# Patient Record
Sex: Female | Born: 1935 | Race: White | Hispanic: No | Smoking: Never smoker
Health system: Southern US, Community
[De-identification: ages and names within clinical notes are randomized; demographics above are authoritative.]

## PROBLEM LIST (undated history)

## (undated) DIAGNOSIS — I10 Essential (primary) hypertension: Secondary | ICD-10-CM

## (undated) DIAGNOSIS — I2510 Atherosclerotic heart disease of native coronary artery without angina pectoris: Secondary | ICD-10-CM

## (undated) DIAGNOSIS — H269 Unspecified cataract: Secondary | ICD-10-CM

## (undated) DIAGNOSIS — E7800 Pure hypercholesterolemia, unspecified: Secondary | ICD-10-CM

## (undated) DIAGNOSIS — M791 Myalgia, unspecified site: Secondary | ICD-10-CM

## (undated) DIAGNOSIS — R110 Nausea: Secondary | ICD-10-CM

## (undated) DIAGNOSIS — M1990 Unspecified osteoarthritis, unspecified site: Secondary | ICD-10-CM

## (undated) HISTORY — DX: Myalgia, unspecified site: M79.10

## (undated) HISTORY — DX: Nausea: R11.0

## (undated) HISTORY — DX: Essential (primary) hypertension: I10

## (undated) HISTORY — DX: Unspecified cataract: H26.9

## (undated) HISTORY — DX: Atherosclerotic heart disease of native coronary artery without angina pectoris: I25.10

## (undated) HISTORY — DX: Pure hypercholesterolemia, unspecified: E78.00

## (undated) HISTORY — PX: CORONARY ARTERY BYPASS GRAFT: SHX141

---

## 1998-02-06 ENCOUNTER — Ambulatory Visit (HOSPITAL_COMMUNITY): Admission: RE | Admit: 1998-02-06 | Discharge: 1998-02-06 | Payer: Self-pay | Admitting: Obstetrics and Gynecology

## 1998-02-06 ENCOUNTER — Encounter: Payer: Self-pay | Admitting: Obstetrics and Gynecology

## 1998-02-11 ENCOUNTER — Other Ambulatory Visit: Admission: RE | Admit: 1998-02-11 | Discharge: 1998-02-11 | Payer: Self-pay | Admitting: Obstetrics and Gynecology

## 1999-02-12 ENCOUNTER — Other Ambulatory Visit: Admission: RE | Admit: 1999-02-12 | Discharge: 1999-02-12 | Payer: Self-pay | Admitting: Podiatry

## 1999-03-05 ENCOUNTER — Other Ambulatory Visit: Admission: RE | Admit: 1999-03-05 | Discharge: 1999-03-05 | Payer: Self-pay | Admitting: Obstetrics and Gynecology

## 1999-12-24 ENCOUNTER — Encounter: Payer: Self-pay | Admitting: Obstetrics and Gynecology

## 1999-12-24 ENCOUNTER — Encounter: Admission: RE | Admit: 1999-12-24 | Discharge: 1999-12-24 | Payer: Self-pay | Admitting: Obstetrics and Gynecology

## 2000-01-04 ENCOUNTER — Other Ambulatory Visit: Admission: RE | Admit: 2000-01-04 | Discharge: 2000-01-04 | Payer: Self-pay | Admitting: Obstetrics and Gynecology

## 2001-01-03 ENCOUNTER — Encounter: Payer: Self-pay | Admitting: Obstetrics and Gynecology

## 2001-01-03 ENCOUNTER — Encounter: Admission: RE | Admit: 2001-01-03 | Discharge: 2001-01-03 | Payer: Self-pay | Admitting: Obstetrics and Gynecology

## 2001-06-01 ENCOUNTER — Ambulatory Visit (HOSPITAL_COMMUNITY): Admission: RE | Admit: 2001-06-01 | Discharge: 2001-06-01 | Payer: Self-pay | Admitting: Gastroenterology

## 2002-08-14 ENCOUNTER — Encounter: Payer: Self-pay | Admitting: Obstetrics and Gynecology

## 2002-08-14 ENCOUNTER — Encounter: Admission: RE | Admit: 2002-08-14 | Discharge: 2002-08-14 | Payer: Self-pay | Admitting: Obstetrics and Gynecology

## 2003-08-27 ENCOUNTER — Other Ambulatory Visit: Admission: RE | Admit: 2003-08-27 | Discharge: 2003-08-27 | Payer: Self-pay | Admitting: Obstetrics and Gynecology

## 2003-09-17 ENCOUNTER — Encounter: Admission: RE | Admit: 2003-09-17 | Discharge: 2003-09-17 | Payer: Self-pay | Admitting: Obstetrics and Gynecology

## 2004-09-17 ENCOUNTER — Encounter: Admission: RE | Admit: 2004-09-17 | Discharge: 2004-09-17 | Payer: Self-pay | Admitting: Obstetrics and Gynecology

## 2004-09-28 ENCOUNTER — Other Ambulatory Visit: Admission: RE | Admit: 2004-09-28 | Discharge: 2004-09-28 | Payer: Self-pay | Admitting: Obstetrics and Gynecology

## 2005-09-20 ENCOUNTER — Encounter: Admission: RE | Admit: 2005-09-20 | Discharge: 2005-09-20 | Payer: Self-pay | Admitting: Obstetrics and Gynecology

## 2005-10-07 ENCOUNTER — Encounter: Admission: RE | Admit: 2005-10-07 | Discharge: 2005-10-07 | Payer: Self-pay | Admitting: Obstetrics and Gynecology

## 2005-10-13 ENCOUNTER — Other Ambulatory Visit: Admission: RE | Admit: 2005-10-13 | Discharge: 2005-10-13 | Payer: Self-pay | Admitting: Obstetrics and Gynecology

## 2006-04-05 ENCOUNTER — Encounter: Admission: RE | Admit: 2006-04-05 | Discharge: 2006-04-05 | Payer: Self-pay | Admitting: Orthopedic Surgery

## 2006-04-21 ENCOUNTER — Encounter: Admission: RE | Admit: 2006-04-21 | Discharge: 2006-04-21 | Payer: Self-pay | Admitting: Orthopedic Surgery

## 2006-05-02 ENCOUNTER — Encounter: Admission: RE | Admit: 2006-05-02 | Discharge: 2006-05-02 | Payer: Self-pay | Admitting: Orthopedic Surgery

## 2006-08-24 ENCOUNTER — Encounter: Admission: RE | Admit: 2006-08-24 | Discharge: 2006-08-24 | Payer: Self-pay | Admitting: Neurosurgery

## 2006-09-29 ENCOUNTER — Inpatient Hospital Stay (HOSPITAL_COMMUNITY): Admission: RE | Admit: 2006-09-29 | Discharge: 2006-10-03 | Payer: Self-pay | Admitting: Neurosurgery

## 2006-11-16 ENCOUNTER — Encounter: Admission: RE | Admit: 2006-11-16 | Discharge: 2006-11-16 | Payer: Self-pay | Admitting: Obstetrics and Gynecology

## 2006-11-24 ENCOUNTER — Other Ambulatory Visit: Admission: RE | Admit: 2006-11-24 | Discharge: 2006-11-24 | Payer: Self-pay | Admitting: Obstetrics and Gynecology

## 2007-07-10 ENCOUNTER — Ambulatory Visit: Payer: Self-pay | Admitting: Vascular Surgery

## 2007-07-10 ENCOUNTER — Ambulatory Visit (HOSPITAL_COMMUNITY): Admission: RE | Admit: 2007-07-10 | Discharge: 2007-07-10 | Payer: Self-pay | Admitting: Cardiology

## 2007-07-10 ENCOUNTER — Encounter: Payer: Self-pay | Admitting: Cardiology

## 2007-07-10 ENCOUNTER — Inpatient Hospital Stay (HOSPITAL_BASED_OUTPATIENT_CLINIC_OR_DEPARTMENT_OTHER): Admission: RE | Admit: 2007-07-10 | Discharge: 2007-07-10 | Payer: Self-pay | Admitting: Cardiology

## 2007-07-17 ENCOUNTER — Ambulatory Visit: Payer: Self-pay | Admitting: Surgery

## 2007-07-23 ENCOUNTER — Ambulatory Visit: Payer: Self-pay | Admitting: Surgery

## 2007-07-23 ENCOUNTER — Inpatient Hospital Stay (HOSPITAL_COMMUNITY): Admission: RE | Admit: 2007-07-23 | Discharge: 2007-08-01 | Payer: Self-pay | Admitting: Surgery

## 2007-08-14 ENCOUNTER — Ambulatory Visit: Payer: Self-pay | Admitting: Surgery

## 2007-08-16 ENCOUNTER — Encounter (HOSPITAL_COMMUNITY): Admission: RE | Admit: 2007-08-16 | Discharge: 2007-11-14 | Payer: Self-pay | Admitting: Cardiology

## 2007-11-20 ENCOUNTER — Encounter: Admission: RE | Admit: 2007-11-20 | Discharge: 2007-11-20 | Payer: Self-pay | Admitting: Obstetrics and Gynecology

## 2007-11-27 ENCOUNTER — Other Ambulatory Visit: Admission: RE | Admit: 2007-11-27 | Discharge: 2007-11-27 | Payer: Self-pay | Admitting: Obstetrics and Gynecology

## 2007-12-03 ENCOUNTER — Encounter: Admission: RE | Admit: 2007-12-03 | Discharge: 2007-12-03 | Payer: Self-pay | Admitting: Obstetrics and Gynecology

## 2008-06-19 ENCOUNTER — Emergency Department (HOSPITAL_COMMUNITY): Admission: EM | Admit: 2008-06-19 | Discharge: 2008-06-19 | Payer: Self-pay | Admitting: Emergency Medicine

## 2008-11-20 ENCOUNTER — Encounter: Admission: RE | Admit: 2008-11-20 | Discharge: 2008-11-20 | Payer: Self-pay | Admitting: Obstetrics and Gynecology

## 2008-12-11 ENCOUNTER — Other Ambulatory Visit: Admission: RE | Admit: 2008-12-11 | Discharge: 2008-12-11 | Payer: Self-pay | Admitting: Obstetrics and Gynecology

## 2009-12-15 ENCOUNTER — Encounter: Admission: RE | Admit: 2009-12-15 | Discharge: 2009-12-15 | Payer: Self-pay | Admitting: Obstetrics and Gynecology

## 2010-02-03 ENCOUNTER — Other Ambulatory Visit
Admission: RE | Admit: 2010-02-03 | Discharge: 2010-02-03 | Payer: Self-pay | Source: Home / Self Care | Admitting: Obstetrics and Gynecology

## 2010-03-29 ENCOUNTER — Ambulatory Visit: Payer: Self-pay | Admitting: Cardiology

## 2010-04-01 ENCOUNTER — Encounter: Admission: RE | Admit: 2010-04-01 | Discharge: 2010-04-01 | Payer: Self-pay | Admitting: Cardiology

## 2010-04-01 ENCOUNTER — Ambulatory Visit: Payer: Self-pay | Admitting: Cardiology

## 2010-07-11 ENCOUNTER — Encounter: Payer: Self-pay | Admitting: Surgery

## 2010-07-11 ENCOUNTER — Encounter: Payer: Self-pay | Admitting: Orthopedic Surgery

## 2010-07-11 ENCOUNTER — Encounter: Payer: Self-pay | Admitting: Obstetrics and Gynecology

## 2010-09-20 ENCOUNTER — Other Ambulatory Visit: Payer: Self-pay | Admitting: Obstetrics and Gynecology

## 2010-09-20 DIAGNOSIS — Z1231 Encounter for screening mammogram for malignant neoplasm of breast: Secondary | ICD-10-CM

## 2010-09-29 ENCOUNTER — Other Ambulatory Visit: Payer: Self-pay | Admitting: *Deleted

## 2010-09-29 DIAGNOSIS — E7800 Pure hypercholesterolemia, unspecified: Secondary | ICD-10-CM

## 2010-10-01 ENCOUNTER — Other Ambulatory Visit (INDEPENDENT_AMBULATORY_CARE_PROVIDER_SITE_OTHER): Payer: BC Managed Care – PPO | Admitting: *Deleted

## 2010-10-01 DIAGNOSIS — E7800 Pure hypercholesterolemia, unspecified: Secondary | ICD-10-CM

## 2010-10-01 LAB — HEPATIC FUNCTION PANEL
AST: 18 U/L (ref 0–37)
Albumin: 3.6 g/dL (ref 3.5–5.2)
Alkaline Phosphatase: 82 U/L (ref 39–117)
Bilirubin, Direct: 0.1 mg/dL (ref 0.0–0.3)

## 2010-10-01 LAB — BASIC METABOLIC PANEL
CO2: 29 mEq/L (ref 19–32)
GFR: 67.69 mL/min (ref >60.00)
Glucose, Bld: 95 mg/dL (ref 70–99)
Potassium: 4.5 mEq/L (ref 3.5–5.1)
Sodium: 141 mEq/L (ref 135–145)

## 2010-10-01 LAB — LIPID PANEL
Total CHOL/HDL Ratio: 3
VLDL: 18 mg/dL (ref 0.0–40.0)

## 2010-10-05 ENCOUNTER — Encounter: Payer: Self-pay | Admitting: Cardiology

## 2010-10-05 DIAGNOSIS — M791 Myalgia, unspecified site: Secondary | ICD-10-CM | POA: Insufficient documentation

## 2010-10-05 DIAGNOSIS — R110 Nausea: Secondary | ICD-10-CM | POA: Insufficient documentation

## 2010-10-05 DIAGNOSIS — I10 Essential (primary) hypertension: Secondary | ICD-10-CM | POA: Insufficient documentation

## 2010-10-05 DIAGNOSIS — I2510 Atherosclerotic heart disease of native coronary artery without angina pectoris: Secondary | ICD-10-CM | POA: Insufficient documentation

## 2010-10-05 DIAGNOSIS — E7800 Pure hypercholesterolemia, unspecified: Secondary | ICD-10-CM | POA: Insufficient documentation

## 2010-10-06 ENCOUNTER — Encounter: Payer: Self-pay | Admitting: Cardiology

## 2010-10-06 ENCOUNTER — Ambulatory Visit (INDEPENDENT_AMBULATORY_CARE_PROVIDER_SITE_OTHER): Payer: BC Managed Care – PPO | Admitting: Cardiology

## 2010-10-06 DIAGNOSIS — I10 Essential (primary) hypertension: Secondary | ICD-10-CM

## 2010-10-06 DIAGNOSIS — E7800 Pure hypercholesterolemia, unspecified: Secondary | ICD-10-CM

## 2010-10-06 NOTE — Assessment & Plan Note (Signed)
This patient has a history of essential hypertension.  His last visit she's been doing well.  She's not having any chest pain or shortness of breath.  She's not having any dizziness or syncope.  She's not been aware of any arrhythmias or tachycardia.

## 2010-10-06 NOTE — Assessment & Plan Note (Signed)
The patient remains on simvastatin for her hypercholesterolemia.  The patient is status post CABG in 2009.  She has not had any significant myalgias from the low dose simvastatin at the present time.

## 2010-10-06 NOTE — Progress Notes (Signed)
[name] Date of Birth:  1934/06/25 [facility] / [facility] [address]   [address] [place], [place]  [address] [phone]           Fax   [phone]  History of Present Illness: This pleasant 76 year old woman is seen for a scheduled followup office visit.  She has a history of coronary disease and had coronary artery bypass graft surgery in thousand and 9.  She's also had hypercholesterolemia and essential hypertension.  She is not having any exertional chest pain.  She's having no shortness of breath palpitations dizziness or syncope.  She's not had any recent ischemic workup.  She remains physically active and still works full-time.  She is careful about her cholesterol but she does eat a lot of salty foods  Current Outpatient Prescriptions  Medication Sig Dispense Refill  . aspirin 81 MG tablet Take 81 mg by mouth daily.        Marland Kitchen losartan (COZAAR) 100 MG tablet Take 100 mg by mouth daily.        . metoprolol tartrate (LOPRESSOR) 25 MG tablet Take 25 mg by mouth 2 (two) times daily.        . Multiple Vitamin (MULTIVITAMIN) tablet Take 1 tablet by mouth daily.        . simvastatin (ZOCOR) 80 MG tablet Take 40 mg by mouth at bedtime.         Allergies  Allergen Reactions  . Amiodarone     rash  . Crestor (Rosuvastatin Calcium)     Leg pain  . Ramipril     cough    Patient Active Problem List  Diagnoses  . Hypertension  . Hypercholesterolemia  . Coronary artery disease  . Nausea  . Myalgia    History  Smoking status  . Never Smoker   Smokeless tobacco  . Not on file    History  Alcohol Use No    Family History  Problem Relation Age of Onset  . Stroke Mother   . Cancer Father     Review of Systems: Constitutional: no fever chills diaphoresis or fatigue or change in weight.  Head and neck: no hearing loss, no epistaxis, no photophobia or visual disturbance. Respiratory: No cough, shortness of breath or wheezing. Cardiovascular: No  chest pain peripheral edema, palpitations. Gastrointestinal: No abdominal distention, no abdominal pain, no change in bowel habits hematochezia or melena. Genitourinary: No dysuria, no frequency, no urgency, no nocturia. Musculoskeletal:No arthralgias, no back pain, no gait disturbance or myalgias. Neurological: No dizziness, no headaches, no numbness, no seizures, no syncope, no weakness, no tremors. Hematologic: No lymphadenopathy, no easy bruising. Psychiatric: No confusion, no hallucinations, no sleep disturbance.    Physical Exam: Filed Vitals:   10/06/10 1041  BP: 140/90  Pulse: 68   weight is 154, up 3 pounds.  The general appearance feels a well-developed well-nourished woman in no distress.Pupils equal and reactive.   Extraocular Movements are full.  There is no scleral icterus.  The mouth and pharynx are normal.  The neck is supple.  The carotids reveal no bruits.  The jugular venous pressure is normal.  The thyroid is not enlarged.  There is no lymphadenopathy.The chest is clear to percussion and auscultation. There are no rales or rhonchi. Expansion of the chest is symmetrical.The precordium is quiet.  The first heart sound is normal.  The second heart sound is physiologically split.  There is no murmur gallop rub or click.  There is no  abnormal lift or heave.The abdomen is soft and nontender. Bowel sounds are normal. The liver and spleen are not enlarged. There Are no abdominal masses. There are no bruits. Normal extremity without phlebitis or edema.Strength is normal and symmetrical in all extremities.  There is no lateralizing weakness.  There are no sensory deficits.   Assessment / Plan:   For her blood pressure she is going to decrease dietary salt intake.  Blood pressure remains high she may need a diuretic as well.  We gave her a prescription for the shingles vaccine at her request.  She has an upcoming colonoscopy and she'll stop aspirin 5 days before the colonoscopy.  Recheck  in 6 months for followup office visit and fasting lab work

## 2010-11-02 NOTE — Assessment & Plan Note (Signed)
OFFICE VISIT   [name], [name]  DOB:  [birth year]                                        August 14, 2007  CHART #:  [id number]   [name] returned to my office today for followup status post coronary  artery bypass graft surgery x4 on July 23, 2007.  Her postoperative  course was complicated by recurrent atrial fibrillation that was  difficult to control, and she ws discharged on amiodarone, Cardizem,  Lopressor and Coumadin.  Since discharge she has been feeling fairly  well overall.  She has mild right-sided chest wall soreness.  She denies  any palpitations.  She has had no shortness of breath.   PHYSICAL EXAMINATION:  Her blood pressure is 151/82 and her pulse is 60,  regular.  Respiratory rate is 18 and unlabored.  Oxygen saturation on  room air is 99%.  She looks well.  CARDIAC:  Exam shows a regular rate and rhythm with normal heart sounds.  LUNGS:  Exam clear.  Chest incision is healing well and the sternum is  stable.  Both leg incisions are healing well and there is no significant  lower extremity edema.   MEDICATIONS:  1. Lopressor 25 mg b.i.d.  2. Zocor 40 mg daily.  3. Aspirin 325 mg daily.  4. Cartia 240 mg daily.  5. Oxycodone p.r.n. for pain.  Her amiodarone had been discontinued due to development of a diffuse  rash.  Coumadin has also been discontinued.   IMPRESSION:  Overall, [name] appears to be making a good recovery  following her surgery.  She is planning on starting cardiac rehab this  Friday.  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  She will continue to follow  up with Dr. Patty Sermons, and will contact me if she develops any problems  with her incisions.   Evelene Croon, M.D.  Electronically Signed   BB/MEDQ  D:  08/14/2007  T:  08/14/2007  Job:  [id number]   cc:   Cassell Clement,  M.D.

## 2010-11-02 NOTE — Consult Note (Signed)
NEW PATIENT CONSULTATION   [name], [name]  DOB:  07/22/1934                                        July 17, 2007  CHART #:  [id number]   REFERRING PHYSICIAN:  Peter M. Swaziland, MD  Cassell Clement, MD   REASON FOR CONSULTATION:  Severe 3-vessel coronary disease.   CLINICAL HISTORY:  This patient is a 76 year old woman with a family  history of heart disease who presented with a several month history of  fatigue.  She had noticed while doing yard work and housework she became  out of breath and began having aching and tightness in her chest.  About  2 weeks ago she had an episode after dinner of acute chest pain  associated with nausea.  It resolved and later that night she awoke from  sleep with acute chest pain with nausea and vomiting.  This pain lasted  about 20 minutes and resolved on its own.  She was seen by Dr. Patty Sermons  that day and underwent a stress Cardiolite study which was markedly  abnormal with development of typical chest tightness and dyspnea as well  as inferolateral ST depression.  Cardiolite images show significant  apical ischemia with an ejection fraction of 64%.  She subsequently  underwent cardiac catheterization which was done on July 10, 2007.  The left main had no significant disease.  The LAD had a 60% narrowing  proximally prior to the take off of the first diagonal branch and then  was occluded after the first diagonal with filling of a small distal  vessel by collaterals from the left.  The first diagonal had no  significant disease.  The left circumflex gave rise to 3 marginals.  The  first marginal had no significant stenosis.  The second and third  marginal branches had 90% bifurcation stenosis.  The right coronary  artery is a large dominant vessel that had a focal, 95% mid vessel  stenosis.  There is a large posterior descending that had about 40 to  50% proximal stenosis.  There were 2 smaller posterolateral  branches.  Left ventricular ejection fraction was 60% with no mitral regurgitation,  and normal systolic function.   The patient was placed on Lopressor and aspirin and has had no further  chest pain since that episode.  She does have sublingual nitroglycerins,  which she has not had to take so far.  She has not returned to work.   REVIEW OF SYSTEMS:  GENERAL:  She denies any fever or chills.  She has  had no recent weight changes.  She has had fatigue.  EYES:  Negative.  ENT:  Negative.  ENDOCRINE:  She denies diabetes and hypothyroidism.  CARDIOVASCULAR:  As above.  She denies PND and orthopnea.  She has had  some exertional dyspnea.  She denies palpitations and peripheral edema.  RESPIRATORY:  She denies cough and sputum production.  GI:  She has had no nausea or vomiting.  She denies melena and bright  red blood per rectum.  GU:  She denies dysuria or hematuria.  VASCULAR:  She denies claudication and phlebitis.  She has never had a  DVT.  NEUROLOGIC:  She denies any focal weakness or numbness.  She has never  had TIA or a stroke.  MUSCULOSKELETAL:  She denies arthralgias or myalgias.  PSYCHIATRIC:  Negative.  HEMATOLOGICAL:  Negative.   ALLERGIES:  SULFA.   MEDICATIONS:  1. Lopressor 25 mg b.i.d.  2. Nitroglycerin 0.4 mg sublingual p.r.n.  3. Zocor 40 mg daily.  4. Aspirin 325 mg daily.   PAST MEDICAL HISTORY:  Significant for surgery on her back in the spring  of 2008.  She is status post partial hysterectomy in the past.  She is  status post foot surgery in the past.  She denies hypertension, but does  have hypercholesterolemia.   FAMILY HISTORY:  Father died of cancer at age 66, and her mother died of  a stroke in her 84s.  Her husband died in a plane crash in 1992-10-31.  She  has 2 children and 1 son, who is handicapped and lives with her.  He  suffered brain damage in a motor vehicle accident in the past.   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  She has never smoked and denies alcohol use.   PHYSICAL EXAMINATION:  On physical examination her blood pressure is  162/86 and her pulse is 52 and regular.  Respiratory rate is 18 and  unlabored.  Oxygen saturation on room air is 95%.  General:  She is a  well-developed, white female in no distress.  HEENT:  Shows her to be  normocephalic and atraumatic.  Pupils are equal and reactive to light  and accommodation.  Extraocular muscles are intact.  Her throat is  clear.  Neck:  Shows normal carotid pulses bilaterally.  There are no  bruits.  There is no adenopathy or thyromegaly.  Cardiac:  Shows a  regular rate and rhythm with normal S1 and S2.  There is no murmur, rub,  or gallop.  Lungs:  Clear.  Abdomen:  Shows active bowel sounds.  Abdomen is soft, and nontender.  There are no palpable masses or  organomegaly.  Extremities:  Shows no peripheral edema.  Pedal pulses  are palpable bilaterally.  Skin:  Warm and dry. Neurologic:  Shows her  to be alert and oriented x3.  Motor and sensory exam is grossly normal.   Carotid Doppler examination shows no evidence of internal carotid artery  stenosis bilaterally.   IMPRESSION:  [name] has severe 3-vessel coronary disease with  markedly abnormal stress test, initially presenting with unstable  angina.  I agree that coronary artery bypass graft surgery is the best  treatment to prevent further ischemia and infarction.  I told her that I  could do surgery tomorrow or Monday, July 23, 2007.  She would not  want to have it tomorrow because she needs time to discuss things with  her sons, and make sure everything is set at home for her son who lives  with her.  She would like to have surgery performed on July 23, 2007.  I discussed the operation with her including alternatives, benefits, and  risks including, but not limited to bleeding, blood transfusion,  infection, stroke, myocardial  infarction, graft failure, and death.  She  understands and would like to proceed with surgery.  We will plan on  surgery July 23, 2007.   Evelene Croon, M.D.  Electronically Signed   BB/MEDQ  D:  07/17/2007  T:  07/17/2007  Job:  [id number]   cc:   Artist Pais, M.D.  Cassell Clement, M.D.

## 2010-11-02 NOTE — H&P (Signed)
NAME:  [name], [name] NO.:  [id number]   MEDICAL RECORD NO.:  [id number]           PATIENT TYPE:   LOCATION:                                 FACILITY:   PHYSICIAN:  Peter M. Swaziland, M.D.  DATE OF BIRTH:  August 01, 1934   DATE OF ADMISSION:  07/10/2007  DATE OF DISCHARGE:                              HISTORY & PHYSICAL   HISTORY OF PRESENT ILLNESS:  Ms. [name] is a very pleasant, 76 year old  white female who is seen for evaluation of chest pain.  She is a  nonsmoker.  She does have a family history of coronary disease.  She  states that for several months she has noticed difficulty with stamina.  When she tried to do yard work, she would get out of breath with  exertion and sometimes get an aching and tightness across her chest.  Approximately 5 days ago she had an episode after supper of acute chest  pain associated with nausea, but no vomiting.  Later that night in bed  she awoke from sleep, rolled over and developed acute chest pain with  nausea and vomiting.  She had some weakness in her arms and felt cold,  but did not have any diaphoresis.  She subsequently had a normal ECG and  was referred for further evaluation.  She was seen by Dr. Patty Sermons, who  performed a stress Cardiolite study.  The patient was able to exercise  for 6 minutes on the Bruce protocol.  She developed typical chest  tightness and dyspnea and had ST depression in inferolateral leads.  Subsequent Cardiolite images demonstrate a significant apical ischemic  abnormality and ejection fraction was normal at 64%.  Given these  findings, it is recommended that the patient undergo cardiac  catheterization.   PAST MEDICAL HISTORY:  She has had prior surgery on her back in the  spring of 2008.  She has had previous partial hysterectomy, and previous  foot surgery in the past.  Otherwise she has been relatively healthy.  She has no history of diabetes or thyroid disease.  She has had some  elevation  of her cholesterol.  She denies any history of hypertension.   She is allergic to SULFA.   CURRENT MEDICATIONS:  Include multivitamin daily, aspirin daily,  Lopressor 25 mg b.i.d., Simvastatin 40 mg per day.   SOCIAL HISTORY:  She is working full time.  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  She denies alcohol or  tobacco use.   FAMILY HISTORY:  Father died of cancer at age 36.  Mother died in her  64s of  a stroke.  She has 4 sisters, all of whom are healthy.  She is a  widow.  Her husband died in a plane crash in Nov 26, 1992.  She has 2 children;  1 son is handicapped and lives with her.  He suffered brain damage  during a motor vehicle accident in the past.   REVIEW OF SYSTEMS:  She denies any dyspepsia or heart burn.  She has had  some gradual increase in  blood pressure over the years, but had not  required medication previously.  She denies any change in bowel habits,  or urine symptoms.  She has a remote history of anemia.  Other review of  systems are negative.   PHYSICAL EXAMINATION:  The patient is a pleasant white female in no  apparent distress.  Weight is 146.  Blood pressure is 154/94.  Pulse 64  and regular.  HEENT:  She is normocephalic, atraumatic.  Pupils equal, round, reactive  to light and accommodation.  Extraocular movements are full.  Oropharynx  is clear.  NECK:  Supple, without JVD, adenopathy, thyromegaly, or bruits.  LUNGS:  Clear.  CARDIAC:  Reveals a regular rate and rhythm without gallop, murmur, rub,  or click.  ABDOMEN:  Soft, nontender without masses or bruits.  Femoral and pedal pulses are 2+ and symmetric.  She has no phlebitis.  NEUROLOGIC:  Nonfocal.   LABORATORY DATA:  Chest x-ray is unremarkable.  ECG is normal.  Additional laboratory data, chemistry panel was normal.  BUN was 21,  creatinine 0.9.  CBC was normal.  Cholesterol is 271.  Triglycerides  180.  HDL 49, LDL of 185.  Thyroid studies were normal.    IMPRESSION:  1. Recent onset of angina in patient with abnormal stress Cardiolite      studies showing apical ischemia.  2. Marked hypercholesterolemia.  3. Elevated blood pressure.   PLAN:  Will proceed with diagnostic cardiac catheterization with further  therapy pending these results.           ______________________________  Peter M. Swaziland, M.D.     PMJ/MEDQ  D:  07/06/2007  T:  07/06/2007  Job:  [id number]   cc:   Juluis Rainier, MD  Cassell Clement, M.D.

## 2010-11-02 NOTE — Op Note (Signed)
NAMEDESIRAYE, ROLFSON NO.:  [id number]   MEDICAL RECORD NO.:  [id number]          PATIENT TYPE:  INP   LOCATION:  2313                         FACILITY:  [facility]   PHYSICIAN:  Evelene Croon, M.D.     DATE OF BIRTH:  03/22/1935   DATE OF PROCEDURE:  07/23/2007  DATE OF DISCHARGE:                               OPERATIVE REPORT   PREOPERATIVE AND POSTOPERATIVE DIAGNOSIS:  Severe 3-vessel coronary  artery disease.   OPERATIVE PROCEDURE:  1. Median sternotomy.  2. Extracorporeal circulation.  3. Coronary bypass graft surgery x4 using a left internal mammary      artery graft to the left anterior descending coronary, with a      sequential saphenous vein graft to the first diagonal branch of the      left anterior descending artery, the second and third obtuse      marginal branches of the left circumflex coronary artery, and a      saphenous vein graft to the posterior descending branch of the      right coronary.  4. Endoscopic vein harvesting from the right leg.   SURGEON:  Evelene Croon, M.D.   ASSISTANT:  Rowe Clack, P.A.-C.   ANESTHESIA:  General endotracheal.   CLINICAL HISTORY:  This patient is a 76 year old woman with a family  history of heart disease who presented with a several-month history of  fatigue.  This turned into shortness of breath, aching, and tightness in  her chest a few weeks ago.  About 2-1/2 weeks ago she had a severe  episode of chest pain with nausea after eating dinner.  It resolved  later that night, but she awoke from sleep with acute chest pain with  nausea and vomiting.  She was seen by Dr. Patty Sermons, and underwent a  stress Cardiolite exam that showed ischemia in the apical region with an  ejection fraction of 64%.  The patient had her typical chest tightness  and dyspnea with inferolateral ST depression during this study.   She subsequently underwent cardiac catheterization on July 10, 2007.  The left main had no  significant disease.  The LAD had 60% proximal  narrowing prior to the takeoff of the first diagonal branch, and then  the LAD was occluded after the first diagonal with filling of a small  distal vessel by collaterals from the left.  The first diagonal had no  significant disease.  The left circumflex gave rise to 3 marginals.  The  first marginal had no significant stenosis.  The second and third  marginals had a 90% bifurcation stenosis.  The right coronary artery was  a large dominant vessel that had a focal 95% midvessel stenosis.  There  was a large posterior descending branch that had about 40%-50% proximal  narrowing.  There were two smaller posterolateral branches.  Ejection  fraction was 60% with no mitral regurgitation, and normal systolic  function.  After review of the angiogram, and examination of the patient  it was felt that coronary bypass graft surgery was the best treatment to  prevent further ischemia  and infarction.  I discussed the operative  procedure with the patient; including alternatives, benefits, and risks;  including but not limited to bleeding, blood transfusion, infection,  stroke, myocardial infarction, graft failure, and death.  She understood  and agreed to proceed.   OPERATIVE PROCEDURE:  The patient was taken to the operating room and  placed on table in the supine position.  After induction of general  endotracheal anesthesia a Foley catheter was placed in the bladder using  sterile technique.  Then the chest, abdomen, and both lower extremities  were prepped and draped in the usual sterile manner.  The chest was  entered through a median sternotomy incision, and the pericardium opened  in the midline.  Examination of the heart showed good ventricular  contractility.  The ascending aorta had no palpable plaques in it.   Then the left internal mammary artery was harvested from the chest wall  as a pedicle graft.  This is a medium caliber vessel with  excellent  blood flow through it.  At the same time a segment of greater saphenous  vein was harvested from the left leg using endoscopic vein harvest  technique.  We initially examined the vein adjacent to the right knee  through a small incision, but this vein was small and not felt to be  ideal; and, therefore, was not harvested.  The vein in the left leg was  of small-to-medium caliber, was felt to be of good quality, and  certainly suitable.  It was very superficial, and difficult to remove  endoscopically; and this required a few other small counter incisions to  divide branches that were immediately under the skin.   Then the side branches of the vein were ligated with silk ties.  A  marking pen was used to mark a stripe down the vein to prevent rotation.  This vein was of good quality.   Then the patient was heparinized and when an adequate ACT was obtained,  the distal ascending aorta was cannulated using a 20-French aortic  cannula for arterial inflow.  Venous outflow was achieved using a 2-  stage venous cannula through the right atrial appendage.  An antegrade  cardioplegia and vent cannula was inserted into the aortic root.   The patient was placed on cardiopulmonary bypass and the distal coronary  was identified.  The LAD was a medium size graftable vessel that looked  better than it did on angiogram.  This freely communicated with a  moderate size second diagonal branch.  The first diagonal was small, but  graftable vessel.  The proximal LAD was heavily diseased at the takeoff  of this diagonal branch.  The left circumflex gave off 3 marginal  branches.  None of these had distal plaque in them.  The first and  second marginal branches were both medium sized graftable vessels.  The  third one was smaller, but felt to be graftable.  The right coronary  artery was diffusely diseased, and this extended to the takeoff of the  posterior descending branch.  The posterior  descending, itself, had some  plaque right at the proximal portion, but the remainder of the vessel  was a large graftable vessel without disease.  There was a  posterolateral branch, immediately adjacent to the posterior descending,  that was a medium-sized vessel.  There was no other smaller second  posterolateral branch.   Then the aorta was cross clamped and 1000 mL of cold blood antegrade  cardioplegia was administered  in the aortic root with quick arrest of  the heart.  Systemic hypothermia to 20-degrees centigrade and topical  hypothermic iced saline was used.  A temperature probe placed in the  septum and an insulating pad in the pericardium.   The first distal anastomosis was performed to the posterior descending  coronary artery.  The internal diameter was about 2 mm.  The conduit  used was a segment of greater saphenous vein, and the anastomosis  performed in an end-to-side manner using continuous 7-0 Prolene suture.  Flow was noted through the graft, and it was excellent.   The second distal anastomosis was performed to the second marginal  branch.  The internal diameter was about 2 mm.  The conduit used was a  second segment of greater saphenous vein.  Anastomosis performed a  sequential side-to-side manner using continuous 7-0 Prolene suture.  Flow was noted through the graft, and it was excellent.   The third distal anastomosis was performed to the third marginal branch.  The internal diameter of this vessel was about to 1.5 mm.  The conduit  used was the same segment of greater saphenous vein, and the anastomosis  performed in a sequential end-to-side manner using continuous 7-0  Prolene suture.  Flow was noted through the graft, and it was excellent.   Then the fourth distal anastomosis was performed to the first diagonal  branch.  The internal diameter was 1.6 mm.  The conduit used was the  same segment of greater saphenous vein and the anastomosis performed in  a  sequential side-to-side manner using a continuous 7-0 Prolene suture.  Flow was noted through the graft, and it was excellent.  Then another  dose of cardioplegia was given down vein graft and the aortic root.   The fifth distal anastomosis was performed to the midportion of the left  anterior descending coronary.  The internal diameter was about 1.6 mm.  The conduit used was a left internal mammary graft, and this was brought  through an opening in the left pericardium anterior to the phrenic  nerve.  It was anastomosed to the LAD in an end-to-side manner using  continuous 8-0 Prolene suture.  The pedicle was sutured to the  epicardium with 6-0 Prolene sutures.  The patient was then rewarmed to  37 degrees centigrade.  With the crossclamp in place the 2 proximal vein  graft anastomoses were performed to the aortic root in an end-to-side  manner using continuous 6-0 Prolene suture.  Then the clamp was removed  from the mammary pedicle.  There was rapid warming of the ventricular  septum, and return of spontaneous ventricular fibrillation.  The  crossclamp was removed with the time of 86 minutes, and the patient was  defibrillated into a sinus rhythm.   The proximal and distal anastomoses appeared hemostatic, and lie in the  grafts satisfactory.  Graft markers were placed around the proximal  anastomoses.  Two temporary right ventricular and atrial pacing wires  were placed and brought out through the skin.   When the patient was rewarmed to 37 degrees centigrade, she was weaned  from cardiopulmonary bypass on no inotropic agents.  Total bypass time  was 100 minutes.  Cardiac function appeared excellent with the cardiac  output of 5 liters per minute.  Protamine was given and the venous and  aortic cannulas were removed without difficulty.  Hemostasis was  achieved.  Three chest tubes were placed into the posterior pericardium  in the left  pleural space, and one in the anterior  mediastinum.  The  pericardium was loosely reapproximated over the heart.  The sternum was  closed with #6 stainless steel wires.  Fascia was closed with continuous  #1 Vicryl suture.  Subcutaneous tissue was closed with continuous 2-0  Vicryl, and the skin with 3-0 Vicryl subcuticular closure.  Lower  extremity vein harvest site was closed in layers in a similar manner.  The sponge, needle, and instrument counts were correct according to the  scrub nurse.  A dry sterile dressing was applied over the incisions,  around the chest tubes which were hooked to Pleur-Evac suction.  The  patient remained hemodynamically stable, and was transported to the SICU  in guarded, but stable condition.      Evelene Croon, M.D.  Electronically Signed     BB/MEDQ  D:  07/23/2007  T:  07/23/2007  Job:  [id number]

## 2010-11-02 NOTE — Cardiovascular Report (Signed)
NAME:  [name], [name] NO.:  [id number]   MEDICAL RECORD NO.:  [id number]          PATIENT TYPE:  OIB   LOCATION:  NA                           FACILITY:  [facility]   PHYSICIAN:  Peter M. Swaziland, M.D.  DATE OF BIRTH:  1934/10/07   DATE OF PROCEDURE:  07/10/2007  DATE OF DISCHARGE:                            CARDIAC CATHETERIZATION   INDICATIONS FOR PROCEDURE:  The patient is 76 year old white female with  history of hypertension, hypercholesterolemia, presents with recent  crescendo angina.  Stress Cardiolite study is positive for anterior wall  ischemia.   PROCEDURE:  Left heart catheterization, coronary left ventricular  angiography.   ACCESS:  Via the right femoral artery using standard Seldinger  technique.   EQUIPMENT:  A 4-French, 4 cm left Judkins' catheter, 4-French 3-D RCA  catheter, 4-French pigtail catheter, 4-French arterial sheath.   MEDICATIONS:  Local anesthesia with 1% Xylocaine, contrast 90 mL of  Omnipaque.   HEMODYNAMIC DATA:  Aortic pressures 166/75 with a mean of 109 mmHg, left  ventricle pressure 159 with EDP of 16 mmHg.  There is no significant  aortic valve gradient by pullback.   ANGIOGRAPHIC DATA:  The left coronary artery arises and distributes  normally.  The left main coronary is normal.   The left anterior descending artery has a 60% narrowing proximally,  prior to the takeoff of the first diagonal branch.  The LAD is then  occluded after the first diagonal branch and fills by left-to-left  collaterals.  The first diagonal is without significant disease.   The left circumflex coronary gives rise to three marginal branches.  There is a 90% stenosis bifurcation lesion, at the origin of the second  and third obtuse marginal vessels.   The right coronary is a large dominant vessel.  It has a focal 95%  stenosis in the mid-vessel.  It terminates in the PDA and two  posterolateral branches.   The left ventricular angiography was  performed in RAO view.  This  demonstrates normal left ventricular size and contractility with normal  systolic function.  Ejection fraction is estimated at 60%.  There was no  significant mitral regurgitation or prolapse.   FINAL INTERPRETATION:  1. Severe three-vessel obstructive atherosclerotic coronary artery      disease.  2. Normal left ventricular function.   PLAN:  Would recommend referral for coronary artery bypass surgery.           ______________________________  Peter M. [place], M.D.     PMJ/MEDQ  D:  07/10/2007  T:  07/10/2007  Job:  [id number]   cc:   Cassell Clement, M.D.  Juluis Rainier, M.D.

## 2010-11-02 NOTE — Discharge Summary (Signed)
[name], [name] NO.:  [id number]   MEDICAL RECORD NO.:  [id number]          PATIENT TYPE:  INP   LOCATION:  2018                         FACILITY:  [facility]   PHYSICIAN:  Evelene Croon, M.D.     DATE OF BIRTH:  [birth year]   DATE OF ADMISSION:  07/23/2007  DATE OF DISCHARGE:  07/31/2007                               DISCHARGE SUMMARY   FINAL DIAGNOSIS:  Severe three-vessel coronary artery disease.   INHOSPITAL DIAGNOSES:  1. Postoperative atrial fibrillation.  2. Postoperative bilateral upper extremity phlebitis.  3. Acute blood loss anemia.  4. Volume overload postoperatively.   OPERATIONS/PROCEDURES:  Coronary artery bypass grafting x4 using a left  internal mammary artery to left anterior descending, sequential  saphenous vein graft to first diagonal branch of left anterior  descending artery, second, third obtuse marginal branches of left  circumflex coronary artery, saphenous vein graft to posterior descending  branch of right coronary artery.  Endoscopic vein harvest from the right  leg.   HISTORY/PHYSICAL/HOSPITAL COURSE:  The patient is a 76 year old female  with a family history of heart disease who presented with a several  month history of fatigue.  This turned into shortness of breath, aching,  and tightness in her chest a few weeks ago.  About 2-1/2 weeks ago, she  had a severe episode of chest pain with nausea after eating dinner.  It  resolved later that night, but she awoke from sleep with acute chest  pain with nausea and vomiting.  She was seen by Dr. Patty Sermons and  underwent stress Cardiolite exam that shows ischemia in the apical  region with ejection fraction of 64%.  The patient had her typical chest  tightness and dyspnea with inferolateral ST depression during the study.  She was then taken for cardiac catheterization July 10, 2007 which  showed severe three-vessel coronary artery disease.  Following  catheterization, Dr. Laneta Simmers  was consulted.  Dr. Laneta Simmers discussed with  the patient undergoing coronary artery bypass grafting.  He discussed  risks and benefits with the patient.  The patient acknowledged her  understanding and agreed to proceed.  Surgery was scheduled for July 23, 2007.  For details of the patient's past medical history and physical  exam, please see dictated H&P.   The patient was taken to the operating room July 23, 2007 where she  underwent coronary artery bypass grafting x4 using a left internal  mammary artery to left anterior descending, sequential saphenous vein  graft to first diagonal branch of left anterior descending artery,  second and third obtuse marginal branches of the left circumflex  coronary artery, saphenous vein graft to posterior descending branch of  the right coronary.  Endoscopic vein harvesting from the right leg.  The  patient tolerated this procedure well and was transferred to the  intensive care unit in stable condition.  Postoperatively, the patient  was noted to be hemodynamically stable.  She was extubated the evening  of surgery.  Post-extubation, the patient noted to be alert and oriented  x4.  Neuro intact.  Post-extubation, the patient placed  on nasal  cannula.  She was satting greater than 90% on nasal cannula.  Follow up  chest x-ray done postop day 1 was clear.  The patient's chest tubes were  deceased in a normal fashion.  Follow up chest x-ray remained stable.  The patient was able to be weaned off her oxygen sating greater than 90%  on room air.  She was using her incentive spirometer.  Postoperatively,  the patient was able to be weaned off all drips with heart rate and  blood pressure remaining stable.  She was noted to be in normal sinus  rhythm initially postoperatively.  On postop day 2, the patient went  into rapid atrial fibrillation.  She was started on IV amiodarone and  was able to convert back to normal sinus rhythm.  The patient  continued  in and out of atrial fibrillation postop day 2.  By postop day 3, she  was noted to be in normal sinus rhythm and was converted to p.o.  amiodarone.  Unfortunately, the patient was not able to stay in normal  sinus rhythm and went back into rapid atrial fibrillation.  She was then  given a bolus of IV amiodarone and eventually replaced on IV amiodarone.  Unfortunately, the patient continued to go in and out of rapid atrial  fibrillation.  She was switched to p.o. amiodarone and started on IV  Cardizem.  The patient was also started on p.o. Coumadin.  On postop day  7 after starting IV Cardizem, the patient was in normal sinus rhythm.  IV Cardizem was discontinued postop day 7.  She was started on p.o.  Cardizem.  Currently, the patient is in normal sinus rhythm.  She was  started on Coumadin and daily PT/INRs were obtained.  Coumadin is being  adjusted based on PT/INR levels.  The patient was continued on p.o.  Lopressor, amiodarone and Cardizem.  After starting the patient on IV  amiodarone, she developed left arm phlebitis.  The IV was switched to  the right arm and unfortunately the patient developed right arm  phlebitis.  She became febrile.  The patient was started on p.o. Keflex.  Warm compresses were applied to bilateral arms.  The patient's fever  resolved and bilateral arm phlebitis was improving.  She was continued  on p.o. Keflex during her hospital stay and planned to continue it at  home.  She still had erythema in bilateral forearms.  The patient had  developed volume overload postoperatively and was started on diuretics.  Eventually, the patient's volume overload improved and diuretics were  discontinued.  The patient had also developed acute blood loss anemia  postoperatively.  She was asymptomatic from this.  She did not require  any transfusions.  Hemoglobin/hematocrit were followed during her  postoperative course and remained stable.  Postoperatively, the  patient  was working well with cardiac rehab.  She was ambulating with  assistance.  The patient was tolerating diet well.  No nausea or  vomiting noted.   Postop day 7, the patient was noted to be afebrile.  The O2 sats greater  than 90% on room air.  Heart rate and blood pressure were stable.  The  patient was below her baseline preoperative weight.   LABORATORY DATA:  White count 11.1, hemoglobin of 11, hematocrit 32.4,  platelet count 372, sodium 138, potassium 3.8, chloride 105, bicarb 20,  BUN 11, creatinine 0.89, glucose 108, INR 1.3.   The patient was in normal sinus rhythm.  Pulmonary status was stable.  All incisions were clean, dry, intact and healing well.  The patient is  tentatively ready for discharge home in the a.m. postop day 8, July 31, 2007, pending she remains in normal sinus rhythm.   FOLLOW UP:  1. Follow up appointment has been arranged with Dr. Laneta Simmers August 14, 2007 at 11:45 a.m.  The patient will need to obtain PA and      lateral chest x-ray 30 minutes prior to this appointment.  2. The patient will need to follow up with Dr. Patty Sermons in 2 weeks.      She will need to contact his office to make these arrangements.   ACTIVITY:  Patient instructed no driving till released to do so, no  lifting over 10 pounds.  She is told to ambulate 3-4 times per day,  progress as tolerated and continue her breathing exercises.   INCISIONAL CARE:  The patient is told to shower washing her incisions  using soap and water.  She is to contact the office if she develops any  drainage or opening from any of her incision sites.   DIET:  The patient educated on diet to be low-fat, low-salt.   DISCHARGE MEDICATIONS:  1. Aspirin 325 mg daily.  2. Lopressor 25 mg b.i.d.  3. Lipitor 20 mg at night.  4. Amiodarone 400 mg b.i.d. x 14 days and 200 mg b.i.d.  5. Cardizem 240 mg daily.  6. Keflex 500 mg t.i.d. x8 days.  7. Coumadin will be dosed based on the patient's  discharge PT/INR      level.  8. Oxycodone 5 mg 1-2 tablets q.4-6 h. p.r.n.      Theda Belfast, [place]      Evelene Croon, M.D.  Electronically Signed    KMD/MEDQ  D:  07/30/2007  T:  07/31/2007  Job:  [id number]   cc:   Evelene Croon, M.D.  Cassell Clement, M.D.

## 2010-11-02 NOTE — Discharge Summary (Signed)
NAMECHAYE, MISCH NO.:  [id number]   MEDICAL RECORD NO.:  [id number]          PATIENT TYPE:  INP   LOCATION:  2018                         FACILITY:  [facility]   PHYSICIAN:  Evelene Croon, M.D.     DATE OF BIRTH:  [birth year]   DATE OF ADMISSION:  07/23/2007  DATE OF DISCHARGE:  08/01/2007                               DISCHARGE SUMMARY   ADDENDUM:  Ms. Grajales continued to progress nicely.  She did convert to a normal  sinus rhythm.  Her Coumadin was monitored closely.  Her INR on August 01, 2007 was 3.0.  Plan regarding Coumadin was for her not to receive  Coumadin on the date of discharge to follow up in the cardiology office  on August 03, 2007, for an INR.  She was to take 1 mg daily until that  appointment.  The Keflex was also discontinued at the time of discharge.  The rest of the medications are as previously dictated in the previous  summary.  There are no other changes associated with this  hospitalization.  For further details please see the original discharge  dictation.      Rowe Clack, P.A.-C.      Evelene Croon, M.D.  Electronically Signed    WEG/MEDQ  D:  10/10/2007  T:  10/11/2007  Job:  [id number]   cc:   Cassell Clement, M.D.

## 2010-11-05 NOTE — Op Note (Signed)
NAMEMarland Thompson  TOMECA, HELM NO.:  [id number]   MEDICAL RECORD NO.:  [id number]          PATIENT TYPE:  INP   LOCATION:  2899                         FACILITY:  [facility]   PHYSICIAN:  Payton Doughty, M.D.      DATE OF BIRTH:  May 29, 1935   DATE OF PROCEDURE:  09/29/2006  DATE OF DISCHARGE:                               OPERATIVE REPORT   PREOPERATIVE DIAGNOSIS:  Degenerative spondylolisthesis of L4-L5 and  spondylosis L5-S1.   POSTOPERATIVE DIAGNOSIS:  Degenerative spondylolisthesis of L4-L5 and  spondylosis L5-S1.   PROCEDURE:  L4-L5 and L5-S1 laminectomy, facetectomy, segmental pedicle  screw fixation from L4 to S1, posterolateral arthrodesis L4 to S1.   ANESTHESIA:  General endotracheal anesthesia.   PREPARATION:  Betadine prep and alcohol wipe.   COMPLICATIONS:  None.   SURGEON:  Payton Doughty, M.D.   ASSISTANTBasilia Jumbo.   BODY OF TEXT:  This is a 76 year old lady with severe degenerative  spondylolisthesis at L4-L5.  She is taken to the operating room,  smoothly anesthetized and intubated, placed prone on the operating  table. Following shave, prep, and drape in the usual sterile fashion,  the skin was infiltrated with 1% lidocaine with 1:400,000 epinephrine.  The skin was incised from mid L3 to the sacrum and the lamina and  transverse processes of L4, L5, and the sacral ala were exposed  bilaterally in the subperiosteal plane.  Intraoperative x-ray confirmed  correctness of the level.  Having confirmed correctness of the level,  the lamina, pars intra-articularis, and inferior facet of L4 and L5 and  the superior facet of L5 and S1 were removed bilaterally using a high  speed drill and the Kerrison.  The bone was set aside for grafting.  The  intention was to decompress, reduce, and place interbody devices;  however, the particular anatomy did not allow placement of interbody  devices, although there was a significant reduction in the  spondylolisthesis and  adequate decompression of both 4 and 5 roots as  they traversed this area. Because the cages could not be placed, pedicle  screws were placed using the standard landmarks.  The rods were  connected to the screws and capped.  Intraoperative x-ray showed good  placement of pedicle screws, rods and caps.  The transverse process and  sacral ala were decorticated with a high speed drill and intertransverse  bone and BMP were laid across them.  Successive layers over 0 Vicryl, 2-  0 Vicryl, and 3-0 nylon were used to close.  Betadine and Telfa dressing  was applied.  The patient returned to the recovery room in good  condition.           ______________________________  Payton Doughty, M.D.    MWR/MEDQ  D:  09/29/2006  T:  09/29/2006  Job:  [id number]

## 2010-11-05 NOTE — H&P (Signed)
[name], [name] NO.:  [id number]   MEDICAL RECORD NO.:  [id number]          PATIENT TYPE:  INP   LOCATION:  2899                         FACILITY:  [facility]   PHYSICIAN:  Payton Doughty, M.D.      DATE OF BIRTH:  Nov 21, 1934   DATE OF ADMISSION:  09/29/2006  DATE OF DISCHARGE:                              HISTORY & PHYSICAL   ADMITTING DIAGNOSES:  1. Spondylolisthesis, L4-5.  2. Spondylosis, L5-S1.   BODY OF TEXT:  This is a very nice 76 year old right-handed white lady,  who a couple of years ago had some pain in her back and down her legs.  She was set up to see me, got better, so she did not come.  The past few  months has had increasing pain in her hips, worse on the left than on  the right, pain down her leg.  MR showed spondylolisthesis of L4 on L5  with bilateral L4 root compression and she is admitted now for  decompression and fusion at 4-5 and 5-1.   MEDICAL HISTORY:  Benign.   MEDICATIONS:  She is only on hydrocodone on a p.r.n. basis.   SURGICAL HISTORY:  1. A hysterectomy 20 years ago.  2. Bone spurs in her feet 10-15 years ago.  3. A tonsillectomy at 76 years of age.   ALLERGIES:  None.   SOCIAL HISTORY:  He does not smoke and does not drink.  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   FAMILY HISTORY:  Parents are deceased; history is not given.   REVIEW OF SYSTEMS:  Remarkable for wearing glasses,  hypercholesterolemia, back pain, leg pain, arthritis and neck pain.   PHYSICAL EXAMINATION:  HEENT:  Exam is within normal limits.  NECK:  She has good range of motion of her neck.  CHEST:  Clear.  CARDIAC:  Regular rate and rhythm.  ABDOMEN:  Nontender with no hepatosplenomegaly.  EXTREMITIES:  Without clubbing or cyanosis.  GU:  Exam is deferred.  VASCULAR:  Peripheral pulses are good.  NEUROLOGICAL:  She is awake, alert and oriented.  Her cranial nerves are  intact.  Motor exam shows 5/5 strength throughout the upper  and lower  extremities on confrontational testing.  Straight leg is negative.  Reflexes are a flicker at the left knee, absent at the right, flicker at  the left ankle, absent at the right.   IMAGING STUDY:  MR demonstrates some spinal stenosis at L4-5 based on  spondylolisthesis.   CLINICAL IMPRESSION:  Neurogenic claudication and lumbar radiculopathy  secondary to degeneration spondylolisthesis.   PLAN:  Laminectomy, diskectomy and fusion at L4-5 and L5-S1.  The risks  and benefits of this approach have been discussed with her and she  wished to proceed.    .           ______________________________  Payton Doughty, M.D.     MWR/MEDQ  D:  09/29/2006  T:  09/30/2006  Job:  [id number]

## 2010-11-05 NOTE — Discharge Summary (Signed)
NAMEVIKTORIYA, [name] NO.:  [id number]   MEDICAL RECORD NO.:  [id number]          PATIENT TYPE:  INP   LOCATION:  3007                         FACILITY:  [facility]   PHYSICIAN:  Stefani Dama, M.D.  DATE OF BIRTH:  1934/07/19   DATE OF ADMISSION:  09/29/2006  DATE OF DISCHARGE:  10/03/2006                               DISCHARGE SUMMARY   ADMITTING DIAGNOSIS:  Lumbar spondylosis and stenosis with degenerative  spondylolisthesis L4-L5.   POSTOPERATIVE DIAGNOSIS:  Lumbar spondylosis and stenosis with  degenerative spondylolisthesis L4-L5.   PROCEDURE:  Lumbar decompression via laminectomy, posterolateral  arthrodesis with pedicle screw fixation L4-L5.   CONDITION ON DISCHARGE:  Improving.   HOSPITAL COURSE:  [name] is a 76 year old individual who was had severe  problems with back pain, neurogenic claudication, and lumbar  radiculopathy.  She is noted to have a severe spondylolisthesis at the  L4-L5 level with a high-grade stenosis.  She has been advised regarding  surgical decompression.  This was undertaken at the L4-L5 level.  She  tolerated the procedure well; and was gradually mobilized and  subsequently has been tolerating the postoperative course very well.  Her incision is clean and dry.  At the current time she is discharged  home with a prescription for hydrocodone in the form of Vicodin #40  without refills, Flexeril 10 mg #30 with p.r.n. refills.  She will be  seen in about a week's time for suture removal.   CONDITION ON DISCHARGE:  Improving.      Stefani Dama, M.D.  Electronically Signed     HJE/MEDQ  D:  10/03/2006  T:  10/03/2006  Job:  [id number]

## 2010-12-17 ENCOUNTER — Ambulatory Visit: Payer: Self-pay

## 2010-12-27 ENCOUNTER — Other Ambulatory Visit: Payer: Self-pay | Admitting: Cardiology

## 2010-12-28 ENCOUNTER — Other Ambulatory Visit: Payer: Self-pay | Admitting: *Deleted

## 2010-12-28 MED ORDER — LOSARTAN POTASSIUM 100 MG PO TABS: 100.0000 mg | ORAL_TABLET | Freq: Every day | ORAL | Status: DC

## 2010-12-28 NOTE — Telephone Encounter (Signed)
Fax received from pharmacy. Refill completed. Jodette Culley Hedeen RN  

## 2011-03-10 LAB — BLOOD GAS, ARTERIAL
Acid-Base Excess: 0.6
Bicarbonate: 24.6 — ABNORMAL HIGH
O2 Saturation: 95.5
TCO2: 25.8
pO2, Arterial: 75.9 — ABNORMAL LOW

## 2011-03-10 LAB — TYPE AND SCREEN

## 2011-03-10 LAB — URINALYSIS, ROUTINE W REFLEX MICROSCOPIC
Nitrite: NEGATIVE
Specific Gravity, Urine: 1.008
pH: 6.5

## 2011-03-10 LAB — APTT: aPTT: 32

## 2011-03-10 LAB — CBC
Hemoglobin: 14.1
Platelets: 305
RDW: 13.6

## 2011-03-10 LAB — COMPREHENSIVE METABOLIC PANEL
ALT: 17
Albumin: 3.8
Alkaline Phosphatase: 89
Potassium: 4.3
Sodium: 137
Total Protein: 6.6

## 2011-03-10 LAB — URINE MICROSCOPIC-ADD ON

## 2011-03-11 LAB — CBC
HCT: 32.4 — ABNORMAL LOW
HCT: 33.9 — ABNORMAL LOW
HCT: 38.1
Hemoglobin: 11 — ABNORMAL LOW
Hemoglobin: 11.5 — ABNORMAL LOW
Hemoglobin: 12.9
MCHC: 33.8
MCHC: 33.9
MCHC: 34
MCV: 89.2
MCV: 89.5
MCV: 89.7
MCV: 90.1
MCV: 90.3
Platelets: 142 — ABNORMAL LOW
Platelets: 188
Platelets: 202
RBC: 3.22 — ABNORMAL LOW
RBC: 3.62 — ABNORMAL LOW
RBC: 3.81 — ABNORMAL LOW
RDW: 13.4
RDW: 13.7
WBC: 11.1 — ABNORMAL HIGH
WBC: 13.9 — ABNORMAL HIGH

## 2011-03-11 LAB — BASIC METABOLIC PANEL
BUN: 11
BUN: 13
BUN: 9
CO2: 27
CO2: 28
Calcium: 8 — ABNORMAL LOW
Chloride: 104
Chloride: 105
Chloride: 105
Chloride: 106
Chloride: 109
Creatinine, Ser: 0.81
Creatinine, Ser: 0.82
Creatinine, Ser: 0.94
GFR calc Af Amer: >60
GFR calc Af Amer: >60
GFR calc non Af Amer: >60
Glucose, Bld: 116 — ABNORMAL HIGH
Glucose, Bld: 150 — ABNORMAL HIGH
Potassium: 3.8
Potassium: 3.8
Sodium: 138
Sodium: 139

## 2011-03-11 LAB — POCT I-STAT 4, (NA,K, GLUC, HGB,HCT)
Glucose, Bld: 113 — ABNORMAL HIGH
Glucose, Bld: 123 — ABNORMAL HIGH
Glucose, Bld: 125 — ABNORMAL HIGH
Glucose, Bld: 134 — ABNORMAL HIGH
Glucose, Bld: 157 — ABNORMAL HIGH
HCT: 20 — ABNORMAL LOW
HCT: 23 — ABNORMAL LOW
HCT: 35 — ABNORMAL LOW
Hemoglobin: 11.2 — ABNORMAL LOW
Hemoglobin: 11.9 — ABNORMAL LOW
Hemoglobin: 6.8 — CL
Hemoglobin: 7.8 — CL
Hemoglobin: 7.8 — CL
Operator id: 299391
Operator id: 3342
Operator id: 3342
Operator id: 3342
Potassium: 3.1 — ABNORMAL LOW
Potassium: 3.5
Potassium: 3.7
Potassium: 3.8
Potassium: 4.1
Potassium: 4.9
Sodium: 132 — ABNORMAL LOW
Sodium: 135
Sodium: 139

## 2011-03-11 LAB — POCT I-STAT 3, ART BLOOD GAS (G3+)
Acid-Base Excess: 2
Acid-base deficit: 2
Acid-base deficit: 4 — ABNORMAL HIGH
Bicarbonate: 26.8 — ABNORMAL HIGH
O2 Saturation: 100
O2 Saturation: 99
Operator id: 209041
Operator id: 299391
Operator id: 3342
TCO2: 23
pCO2 arterial: 33.4 — ABNORMAL LOW
pCO2 arterial: 43.4
pH, Arterial: 7.42 — ABNORMAL HIGH
pH, Arterial: 7.431 — ABNORMAL HIGH
pO2, Arterial: 294 — ABNORMAL HIGH
pO2, Arterial: 67 — ABNORMAL LOW

## 2011-03-11 LAB — I-STAT EC8
Acid-base deficit: 4 — ABNORMAL HIGH
TCO2: 23
pCO2 arterial: 44.5
pH, Arterial: 7.304 — ABNORMAL LOW

## 2011-03-11 LAB — POCT I-STAT 3, VENOUS BLOOD GAS (G3P V)
Acid-base deficit: 5 — ABNORMAL HIGH
Operator id: 3342
pH, Ven: 7.244 — ABNORMAL LOW

## 2011-03-11 LAB — MAGNESIUM
Magnesium: 2.5
Magnesium: 2.9 — ABNORMAL HIGH

## 2011-03-11 LAB — APTT: aPTT: 41 — ABNORMAL HIGH

## 2011-03-11 LAB — CREATININE, SERUM
GFR calc Af Amer: >60
GFR calc non Af Amer: >60

## 2011-03-11 LAB — PROTIME-INR
INR: 1.3
INR: 3 — ABNORMAL HIGH
Prothrombin Time: 32 — ABNORMAL HIGH

## 2011-03-25 LAB — GLUCOSE, CAPILLARY: Glucose-Capillary: 100 mg/dL — ABNORMAL HIGH (ref 70–99)

## 2011-03-28 ENCOUNTER — Other Ambulatory Visit: Payer: Self-pay | Admitting: Cardiology

## 2011-05-16 ENCOUNTER — Other Ambulatory Visit: Payer: BC Managed Care – PPO | Admitting: *Deleted

## 2011-05-20 ENCOUNTER — Ambulatory Visit: Payer: BC Managed Care – PPO | Admitting: Cardiology

## 2011-06-09 ENCOUNTER — Other Ambulatory Visit: Payer: Self-pay | Admitting: Cardiology

## 2011-06-10 NOTE — Telephone Encounter (Signed)
Refilled simvastatin.

## 2011-06-27 ENCOUNTER — Other Ambulatory Visit: Payer: Self-pay | Admitting: Cardiology

## 2011-06-27 NOTE — Telephone Encounter (Signed)
Refilled losartan 

## 2011-07-11 ENCOUNTER — Other Ambulatory Visit: Payer: BC Managed Care – PPO | Admitting: *Deleted

## 2011-07-12 ENCOUNTER — Ambulatory Visit (INDEPENDENT_AMBULATORY_CARE_PROVIDER_SITE_OTHER): Payer: BC Managed Care – PPO | Admitting: *Deleted

## 2011-07-12 DIAGNOSIS — Z1231 Encounter for screening mammogram for malignant neoplasm of breast: Secondary | ICD-10-CM

## 2011-07-12 DIAGNOSIS — E7800 Pure hypercholesterolemia, unspecified: Secondary | ICD-10-CM

## 2011-07-12 LAB — BASIC METABOLIC PANEL
BUN: 17 mg/dL (ref 6–23)
CO2: 24 mEq/L (ref 19–32)
Calcium: 9.2 mg/dL (ref 8.4–10.5)
Chloride: 106 mEq/L (ref 96–112)
Creatinine, Ser: 0.9 mg/dL (ref 0.4–1.2)

## 2011-07-12 LAB — LIPID PANEL
Cholesterol: 166 mg/dL (ref 0–200)
HDL: 50.2 mg/dL (ref >39.00)
Total CHOL/HDL Ratio: 3
Triglycerides: 116 mg/dL (ref 0.0–149.0)

## 2011-07-12 LAB — HEPATIC FUNCTION PANEL
Bilirubin, Direct: 0 mg/dL (ref 0.0–0.3)
Total Bilirubin: 0.5 mg/dL (ref 0.3–1.2)
Total Protein: 6.8 g/dL (ref 6.0–8.3)

## 2011-07-14 ENCOUNTER — Encounter: Payer: Self-pay | Admitting: Cardiology

## 2011-07-14 ENCOUNTER — Ambulatory Visit (INDEPENDENT_AMBULATORY_CARE_PROVIDER_SITE_OTHER): Payer: BC Managed Care – PPO | Admitting: Cardiology

## 2011-07-14 ENCOUNTER — Other Ambulatory Visit: Payer: BC Managed Care – PPO | Admitting: *Deleted

## 2011-07-14 VITALS — BP 120/84 | HR 60 | Ht 61.0 in | Wt 153.0 lb

## 2011-07-14 DIAGNOSIS — I119 Hypertensive heart disease without heart failure: Secondary | ICD-10-CM

## 2011-07-14 DIAGNOSIS — Z9889 Other specified postprocedural states: Secondary | ICD-10-CM

## 2011-07-14 DIAGNOSIS — E7800 Pure hypercholesterolemia, unspecified: Secondary | ICD-10-CM

## 2011-07-14 DIAGNOSIS — Z951 Presence of aortocoronary bypass graft: Secondary | ICD-10-CM

## 2011-07-14 NOTE — Assessment & Plan Note (Signed)
The patient is on simvastatin 40 mg daily.  She is not having any side effects from the simvastatin in terms of myalgias.  I reviewed her labs today which are satisfactory.

## 2011-07-14 NOTE — Assessment & Plan Note (Signed)
The patient has not been experiencing any symptoms of high blood pressure.  Denies dizzy spells or headaches.  She's having no syncopal spells.

## 2011-07-14 NOTE — Assessment & Plan Note (Signed)
The patient is not having any exertional chest pain to suggest recurrent angina pectoris.  Exercise tolerance is satisfactory.  She does not give any regular exercise routinely.  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

## 2011-07-14 NOTE — Patient Instructions (Signed)
Your physician recommends that you continue on your current medications as directed. Please refer to the Current Medication list given to you today.  Your physician encouraged you to lose weight for better health.  Your physician discussed the importance of regular exercise and recommended that you start or continue a regular exercise program for good health.  Your physician recommends that you return for lab work in: 4 months for fasting cholesterol  Your physician recommends that you schedule a follow-up appointment in: 4 months with Dr. Patty Sermons

## 2011-07-14 NOTE — Progress Notes (Signed)
[name] Date of Birth:  Nov 10, 1934 [facility] [address] [place], [place]  [address] [phone]         Fax   [phone]  History of Present Illness: This 77 year old occasional female is seen for a scheduled followup office visit.  She has a history of ischemic heart disease.  She had coronary artery bypass graft surgery in 2009 after she presented with angina pectoris and a very positive stress test.  She has done well post surgery.  She has not been expressing any recurrent angina.  She has a history of high cholesterol and essential hypertension.  She is nondiabetic and she no longer smokes cigarettes.  Current Outpatient Prescriptions  Medication Sig Dispense Refill  . aspirin 81 MG tablet Take 81 mg by mouth daily.        Marland Kitchen losartan (COZAAR) 100 MG tablet TAKE ONE TABLET BY MOUTH DAILY  90 tablet  3  . metoprolol tartrate (LOPRESSOR) 25 MG tablet TAKE ONE TABLET BY MOUTH TWICE DAILY  180 tablet  3  . Multiple Vitamin (MULTIVITAMIN) tablet Take 1 tablet by mouth daily.        . simvastatin (ZOCOR) 40 MG tablet TAKE ONE TABLET BY MOUTH AT BEDTIME  90 tablet  3    Allergies  Allergen Reactions  . Amiodarone     rash  . Crestor (Rosuvastatin Calcium)     Leg pain  . Ramipril     cough    Patient Active Problem List  Diagnoses  . Hypertension  . Hypercholesterolemia  . Coronary artery disease  . Nausea  . Myalgia    History  Smoking status  . Never Smoker   Smokeless tobacco  . Not on file    History  Alcohol Use No    Family History  Problem Relation Age of Onset  . Stroke Mother   . Cancer Father     Review of Systems: Constitutional: no fever chills diaphoresis or fatigue or change in weight.  Head and neck: no hearing loss, no epistaxis, no photophobia or visual disturbance. Respiratory: No cough, shortness of breath or wheezing. Cardiovascular: No chest pain peripheral edema, palpitations. Gastrointestinal: No  abdominal distention, no abdominal pain, no change in bowel habits hematochezia or melena. Genitourinary: No dysuria, no frequency, no urgency, no nocturia. Musculoskeletal:No arthralgias, no back pain, no gait disturbance or myalgias. Neurological: No dizziness, no headaches, no numbness, no seizures, no syncope, no weakness, no tremors. Hematologic: No lymphadenopathy, no easy bruising. Psychiatric: No confusion, no hallucinations, no sleep disturbance.    Physical Exam: Filed Vitals:   07/14/11 1123  BP: 120/84  Pulse: 60   the general appearance reveals a well-developed well-nourished woman in no distress.The head and neck exam reveals pupils equal and reactive.  Extraocular movements are full.  There is no scleral icterus.  The mouth and pharynx are normal.  The neck is supple.  The carotids reveal no bruits.  The jugular venous pressure is normal.  The  thyroid is not enlarged.  There is no lymphadenopathy.  The chest is clear to percussion and auscultation.  There are no rales or rhonchi.  Expansion of the chest is symmetrical.  The precordium is quiet.  The first heart sound is normal.  The second heart sound is physiologically split.  There is no murmur gallop rub or click.  There is no abnormal lift or heave.  The abdomen is soft and nontender.  The bowel sounds are normal.  The liver and spleen are not enlarged.  There are no abdominal masses.  There are no abdominal bruits.  Extremities reveal good pedal pulses.  There is no phlebitis or edema.  There is no cyanosis or clubbing.  Strength is normal and symmetrical in all extremities.  There is no lateralizing weakness.  There are no sensory deficits.  The skin is warm and dry.  There is no rash.     Assessment / Plan: Continue same medication but try to lose weight.  Also needs to increase her regular aerobic exercise program.  She does have an elliptical machine at home.  Recheck in 4 months for followup office visit and fasting lab  work

## 2011-11-15 ENCOUNTER — Other Ambulatory Visit: Payer: BC Managed Care – PPO

## 2011-11-16 ENCOUNTER — Ambulatory Visit (INDEPENDENT_AMBULATORY_CARE_PROVIDER_SITE_OTHER): Payer: BC Managed Care – PPO | Admitting: *Deleted

## 2011-11-16 DIAGNOSIS — E7800 Pure hypercholesterolemia, unspecified: Secondary | ICD-10-CM

## 2011-11-16 DIAGNOSIS — I2510 Atherosclerotic heart disease of native coronary artery without angina pectoris: Secondary | ICD-10-CM

## 2011-11-16 DIAGNOSIS — I119 Hypertensive heart disease without heart failure: Secondary | ICD-10-CM

## 2011-11-16 LAB — HEPATIC FUNCTION PANEL
AST: 21 U/L (ref 0–37)
Albumin: 3.6 g/dL (ref 3.5–5.2)
Total Bilirubin: 0.6 mg/dL (ref 0.3–1.2)

## 2011-11-16 LAB — LIPID PANEL
Cholesterol: 141 mg/dL (ref 0–200)
HDL: 50.5 mg/dL (ref >39.00)
LDL Cholesterol: 74 mg/dL (ref 0–99)
Triglycerides: 85 mg/dL (ref 0.0–149.0)
VLDL: 17 mg/dL (ref 0.0–40.0)

## 2011-11-16 LAB — BASIC METABOLIC PANEL
BUN: 18 mg/dL (ref 6–23)
Calcium: 9.2 mg/dL (ref 8.4–10.5)
Creatinine, Ser: 1 mg/dL (ref 0.4–1.2)
GFR: 58.14 mL/min — ABNORMAL LOW (ref >60.00)
Glucose, Bld: 106 mg/dL — ABNORMAL HIGH (ref 70–99)
Potassium: 4.4 mEq/L (ref 3.5–5.1)

## 2011-11-16 NOTE — Progress Notes (Signed)
Quick Note:  Please make copy of labs for patient visit. ______ 

## 2011-11-17 ENCOUNTER — Encounter: Payer: Self-pay | Admitting: Cardiology

## 2011-11-17 ENCOUNTER — Ambulatory Visit (INDEPENDENT_AMBULATORY_CARE_PROVIDER_SITE_OTHER): Payer: BC Managed Care – PPO | Admitting: Cardiology

## 2011-11-17 VITALS — BP 140/76 | HR 69 | Resp 18 | Ht 61.0 in | Wt 152.0 lb

## 2011-11-17 DIAGNOSIS — E7800 Pure hypercholesterolemia, unspecified: Secondary | ICD-10-CM

## 2011-11-17 DIAGNOSIS — I2510 Atherosclerotic heart disease of native coronary artery without angina pectoris: Secondary | ICD-10-CM

## 2011-11-17 DIAGNOSIS — I119 Hypertensive heart disease without heart failure: Secondary | ICD-10-CM

## 2011-11-17 NOTE — Progress Notes (Signed)
[name] Date of Birth:  April 08, 1935 [facility] [address] [place], [place]  [address] [phone]         Fax   [phone]  History of Present Illness: This pleasant 77 year old woman is seen for a four-month followup office visit.  She has a past history of ischemic heart disease and is status post CABG in 2009.  At that time she presented with angina pectoris and a very positive stress test.  Since surgery she has done well with no recurrent angina.  The patient has a history of high cholesterol and a history of essential hypertension.  The patient has a special needs son age 57 who lives at home.  He was injured in a motorcycle accident at age 64.  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  Current Outpatient Prescriptions  Medication Sig Dispense Refill  . aspirin 81 MG tablet Take 81 mg by mouth daily.        Marland Kitchen losartan (COZAAR) 100 MG tablet TAKE ONE TABLET BY MOUTH DAILY  90 tablet  3  . metoprolol tartrate (LOPRESSOR) 25 MG tablet TAKE ONE TABLET BY MOUTH TWICE DAILY  180 tablet  3  . Multiple Vitamin (MULTIVITAMIN) tablet Take 1 tablet by mouth daily.        . simvastatin (ZOCOR) 40 MG tablet TAKE ONE TABLET BY MOUTH AT BEDTIME  90 tablet  3    Allergies  Allergen Reactions  . Amiodarone     rash  . Crestor (Rosuvastatin Calcium)     Leg pain  . Ramipril     cough    Patient Active Problem List  Diagnoses  . Hypercholesterolemia  . Coronary artery disease  . Nausea  . Myalgia  . Benign hypertensive heart disease without heart failure  . Hx of CABG    History  Smoking status  . Never Smoker   Smokeless tobacco  . Not on file    History  Alcohol Use No    Family History  Problem Relation Age of Onset  . Stroke Mother   . Cancer Father     Review of Systems: Constitutional: no fever chills diaphoresis or fatigue or change in weight.  Head and  neck: no hearing loss, no epistaxis, no photophobia or visual disturbance. Respiratory: No cough, shortness of breath or wheezing. Cardiovascular: No chest pain peripheral edema, palpitations. Gastrointestinal: No abdominal distention, no abdominal pain, no change in bowel habits hematochezia or melena. Genitourinary: No dysuria, no frequency, no urgency, no nocturia. Musculoskeletal:No arthralgias, no back pain, no gait disturbance or myalgias. Neurological: No dizziness, no headaches, no numbness, no seizures, no syncope, no weakness, no tremors. Hematologic: No lymphadenopathy, no easy bruising. Psychiatric: No confusion, no hallucinations, no sleep disturbance.    Physical Exam: Filed Vitals:   11/17/11 1134  BP: 140/76  Pulse: 69  Resp: 18   the general appearance reveals a well-developed well-nourished middle-aged woman in no distress.Pupils equal and reactive.   Extraocular Movements are full.  There is no scleral icterus.  The mouth and pharynx are normal.  The neck is supple.  The carotids reveal no bruits.  The jugular venous pressure is normal.  The thyroid is not enlarged.  There is no lymphadenopathy.  The chest is clear to percussion and auscultation. There are no rales or rhonchi. Expansion of the chest is symmetrical.  The precordium is quiet.  The  first heart sound is normal.  The second heart sound is physiologically split.  There is no murmur gallop rub or click.  There is no abnormal lift or heave.  The abdomen is soft and nontender. Bowel sounds are normal. The liver and spleen are not enlarged. There Are no abdominal masses. There are no bruits.  The pedal pulses are good.  There is no phlebitis or edema.  There is no cyanosis or clubbing. Strength is normal and symmetrical in all extremities.  There is no lateralizing weakness.  There are no sensory deficits.  The skin is warm and dry.  There is no rash.    Assessment / Plan: The patient is doing well.  We  reviewed her labs which are excellent even know if she has not been exercising.  Her weight is down 1 pound.  She will continue same medication.  She'll work harder on exercise over the summer.  Recheck in 4 months for followup office visit and fasting lab work

## 2011-11-17 NOTE — Patient Instructions (Signed)
Your physician recommends that you continue on your current medications as directed. Please refer to the Current Medication list given to you today.  Your physician recommends that you schedule a follow-up appointment in: 4 months with fasting labs (lp/bmet/hfp)  

## 2011-11-17 NOTE — Assessment & Plan Note (Signed)
The patient has not been experiencing any recurrent chest pain or angina.  She has not been getting much regular exercise.

## 2011-11-17 NOTE — Assessment & Plan Note (Signed)
The patient has a history of hypercholesterolemia.  She is on simvastatin.  She is not having any myalgias from simvastatin.  Her blood work today is excellent.

## 2011-11-17 NOTE — Assessment & Plan Note (Signed)
The patient has not been having any headaches or dizziness.  No chest pain.  No symptoms of congestive heart failure or peripheral edema.

## 2012-01-18 ENCOUNTER — Other Ambulatory Visit: Payer: Self-pay | Admitting: Obstetrics and Gynecology

## 2012-03-12 ENCOUNTER — Other Ambulatory Visit: Payer: BC Managed Care – PPO

## 2012-03-30 ENCOUNTER — Other Ambulatory Visit: Payer: Self-pay | Admitting: Cardiology

## 2012-04-02 ENCOUNTER — Other Ambulatory Visit: Payer: BC Managed Care – PPO

## 2012-04-09 ENCOUNTER — Ambulatory Visit: Payer: BC Managed Care – PPO | Admitting: Cardiology

## 2012-07-05 ENCOUNTER — Other Ambulatory Visit: Payer: Self-pay | Admitting: Emergency Medicine

## 2012-07-05 MED ORDER — LOSARTAN POTASSIUM 100 MG PO TABS: 100.0000 mg | ORAL_TABLET | Freq: Every day | ORAL | Status: DC

## 2012-08-08 ENCOUNTER — Other Ambulatory Visit: Payer: Self-pay | Admitting: Obstetrics and Gynecology

## 2012-08-08 DIAGNOSIS — Z1231 Encounter for screening mammogram for malignant neoplasm of breast: Secondary | ICD-10-CM

## 2012-09-03 ENCOUNTER — Ambulatory Visit: Payer: BC Managed Care – PPO

## 2012-09-19 ENCOUNTER — Other Ambulatory Visit: Payer: Self-pay | Admitting: *Deleted

## 2012-09-19 MED ORDER — SIMVASTATIN 40 MG PO TABS: 40.0000 mg | ORAL_TABLET | Freq: Every day | ORAL | Status: DC

## 2012-12-12 ENCOUNTER — Telehealth: Payer: Self-pay | Admitting: Cardiology

## 2012-12-12 DIAGNOSIS — E7800 Pure hypercholesterolemia, unspecified: Secondary | ICD-10-CM

## 2012-12-12 NOTE — Telephone Encounter (Signed)
Labs orders in epic

## 2012-12-12 NOTE — Telephone Encounter (Signed)
New Prob    Pt wanting blood work done. In July. Orders needed in EPIC.

## 2012-12-17 ENCOUNTER — Other Ambulatory Visit: Payer: Self-pay | Admitting: *Deleted

## 2012-12-17 MED ORDER — SIMVASTATIN 40 MG PO TABS: 40.0000 mg | ORAL_TABLET | Freq: Every day | ORAL | Status: DC

## 2012-12-27 ENCOUNTER — Other Ambulatory Visit: Payer: Self-pay | Admitting: Cardiology

## 2012-12-27 ENCOUNTER — Ambulatory Visit
Admission: RE | Admit: 2012-12-27 | Discharge: 2012-12-27 | Disposition: A | Discharge status: Home / Self Care | Payer: BC Managed Care – PPO | Source: Ambulatory Visit | Attending: Obstetrics and Gynecology | Admitting: Obstetrics and Gynecology

## 2012-12-27 DIAGNOSIS — Z1231 Encounter for screening mammogram for malignant neoplasm of breast: Secondary | ICD-10-CM

## 2013-01-01 ENCOUNTER — Other Ambulatory Visit: Payer: Self-pay | Admitting: Obstetrics and Gynecology

## 2013-01-01 DIAGNOSIS — R928 Other abnormal and inconclusive findings on diagnostic imaging of breast: Secondary | ICD-10-CM

## 2013-01-11 ENCOUNTER — Other Ambulatory Visit: Payer: Self-pay | Admitting: Obstetrics and Gynecology

## 2013-01-11 ENCOUNTER — Ambulatory Visit
Admission: RE | Admit: 2013-01-11 | Discharge: 2013-01-11 | Disposition: A | Discharge status: Home / Self Care | Payer: BC Managed Care – PPO | Source: Ambulatory Visit | Attending: Obstetrics and Gynecology | Admitting: Obstetrics and Gynecology

## 2013-01-11 DIAGNOSIS — R928 Other abnormal and inconclusive findings on diagnostic imaging of breast: Secondary | ICD-10-CM

## 2013-01-14 ENCOUNTER — Other Ambulatory Visit (INDEPENDENT_AMBULATORY_CARE_PROVIDER_SITE_OTHER): Payer: BC Managed Care – PPO

## 2013-01-14 DIAGNOSIS — E7800 Pure hypercholesterolemia, unspecified: Secondary | ICD-10-CM

## 2013-01-14 LAB — BASIC METABOLIC PANEL
BUN: 16 mg/dL (ref 6–23)
Creatinine, Ser: 1 mg/dL (ref 0.4–1.2)
GFR: 57.29 mL/min — ABNORMAL LOW (ref >60.00)
Potassium: 3.8 mEq/L (ref 3.5–5.1)

## 2013-01-14 LAB — LIPID PANEL
Cholesterol: 184 mg/dL (ref 0–200)
HDL: 44.9 mg/dL (ref >39.00)
VLDL: 23.2 mg/dL (ref 0.0–40.0)

## 2013-01-14 LAB — HEPATIC FUNCTION PANEL
ALT: 15 U/L (ref 0–35)
Bilirubin, Direct: 0 mg/dL (ref 0.0–0.3)
Total Protein: 6.7 g/dL (ref 6.0–8.3)

## 2013-01-14 NOTE — Progress Notes (Signed)
Quick Note:  Please make copy of labs for patient visit. ______ 

## 2013-01-16 ENCOUNTER — Encounter: Payer: Self-pay | Admitting: Cardiology

## 2013-01-16 ENCOUNTER — Ambulatory Visit (INDEPENDENT_AMBULATORY_CARE_PROVIDER_SITE_OTHER): Payer: BC Managed Care – PPO | Admitting: Cardiology

## 2013-01-16 VITALS — BP 148/78 | HR 55 | Ht 61.5 in | Wt 149.8 lb

## 2013-01-16 DIAGNOSIS — I119 Hypertensive heart disease without heart failure: Secondary | ICD-10-CM

## 2013-01-16 DIAGNOSIS — I2510 Atherosclerotic heart disease of native coronary artery without angina pectoris: Secondary | ICD-10-CM

## 2013-01-16 DIAGNOSIS — E7800 Pure hypercholesterolemia, unspecified: Secondary | ICD-10-CM

## 2013-01-16 DIAGNOSIS — R110 Nausea: Secondary | ICD-10-CM

## 2013-01-16 MED ORDER — EZETIMIBE 10 MG PO TABS: 10.0000 mg | ORAL_TABLET | Freq: Every day | ORAL | Status: DC

## 2013-01-16 MED ORDER — METOPROLOL TARTRATE 25 MG PO TABS: ORAL_TABLET | ORAL | Status: DC

## 2013-01-16 NOTE — Assessment & Plan Note (Signed)
She has had recurrent chest pain or angina.  She stays busy working during the summer that she does not have much time for exercise.  Fortunately her weight is down 3 pounds

## 2013-01-16 NOTE — Assessment & Plan Note (Signed)
The patient took herself off on simvastatin because it was causing her to have weakness of her voice.  She also is intolerant of Lipitor and Crestor.  Since stopping simvastatin her LDL is above target.  We will put her on ezetimibe 10 mg daily.

## 2013-01-16 NOTE — Assessment & Plan Note (Signed)
Last week she awoke at 3 AM and had an episode of repeated belching lasting about an hour.  There was no chest pain or right upper quadrant pain.  She does not have any history of known cholelithiasis.  If symptoms recur  we can consider gallbladder ultrasound.

## 2013-01-16 NOTE — Patient Instructions (Signed)
START ZETIA 10 MG DAILY  Your physician recommends that you schedule a follow-up appointment in: 4 months with fasting labs (lp/bmet/hfp)

## 2013-01-16 NOTE — Progress Notes (Signed)
[name] Date of Birth:  [birth year] [facility] [address] [place], [place]  [address] [phone]         Fax   [phone]  History of Present Illness: This pleasant 78 year old woman is seen for a scheduled followup office visit. She has a past history of ischemic heart disease and is status post CABG in 2009. At that time she presented with angina pectoris and a very positive stress test. Since surgery she has done well with no recurrent angina. The patient has a history of high cholesterol and a history of essential hypertension. The patient has a special needs son age 42 who lives at home. He was injured in a motorcycle accident at age 54. 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  During the summer she works from 7 AM until 6 PM 4 days a week and she is off on Fridays.   Current Outpatient Prescriptions  Medication Sig Dispense Refill  . aspirin 81 MG tablet Take 81 mg by mouth daily.        Marland Kitchen losartan (COZAAR) 100 MG tablet Take 1 tablet (100 mg total) by mouth daily.  90 tablet  3  . metoprolol tartrate (LOPRESSOR) 25 MG tablet TAKE ONE TABLET BY MOUTH TWICE DAILY  180 tablet  3  . Multiple Vitamin (MULTIVITAMIN) tablet Take 1 tablet by mouth daily.        Marland Kitchen ezetimibe (ZETIA) 10 MG tablet Take 1 tablet (10 mg total) by mouth daily.  90 tablet  3   No current facility-administered medications for this visit.    Allergies  Allergen Reactions  . Amiodarone     rash  . Crestor (Rosuvastatin Calcium)     Leg pain  . Ramipril     cough  . Simvastatin     Week hourse voice    Patient Active Problem List   Diagnosis Date Noted  . Coronary artery disease     Priority: Medium  . Benign hypertensive heart disease without heart failure 07/14/2011  . Hx of CABG 07/14/2011  . Hypercholesterolemia   . Nausea   . Myalgia     History  Smoking status  . Never Smoker     Smokeless tobacco  . Not on file    History  Alcohol Use No    Family History  Problem Relation Age of Onset  . Stroke Mother   . Cancer Father     Review of Systems: Constitutional: no fever chills diaphoresis or fatigue or change in weight.  Head and neck: no hearing loss, no epistaxis, no photophobia or visual disturbance. Respiratory: No cough, shortness of breath or wheezing. Cardiovascular: No chest pain peripheral edema, palpitations. Gastrointestinal: No abdominal distention, no abdominal pain, no change in bowel habits hematochezia or melena. Genitourinary: No dysuria, no frequency, no urgency, no nocturia. Musculoskeletal:No arthralgias, no back pain, no gait disturbance or myalgias. Neurological: No dizziness, no headaches, no numbness, no seizures, no syncope, no weakness, no tremors. Hematologic: No lymphadenopathy, no easy bruising. Psychiatric: No confusion, no hallucinations, no sleep disturbance.    Physical Exam: Filed Vitals:   01/16/13 1114  BP: 148/78  Pulse: 55   the general appearance reveals a well-developed well-nourished elderly woman in no distress.The head and neck exam reveals pupils equal and reactive.  Extraocular movements are full.  There is no scleral icterus.  The mouth and pharynx are normal.  The neck is supple.  The carotids reveal no bruits.  The jugular venous pressure is normal.  The  thyroid is not enlarged.  There is no lymphadenopathy.  The chest is clear to percussion and auscultation.  There are no rales or rhonchi.  Expansion of the chest is symmetrical.  The precordium is quiet.  The first heart sound is normal.  The second heart sound is physiologically split.  There is no murmur gallop rub or click.  There is no abnormal lift or heave.  The abdomen is soft and nontender.  The bowel sounds are normal.  The liver and spleen are not enlarged.  There are no abdominal masses.  There are no abdominal bruits.  Extremities reveal good pedal  pulses.  There is no phlebitis or edema.  There is no cyanosis or clubbing.  Strength is normal and symmetrical in all extremities.  There is no lateralizing weakness.  There are no sensory deficits.  The skin is warm and dry.  There is no rash.  EKG shows sinus bradycardia and left anterior fascicular block.  No old tracings to compare   Assessment / Plan: Continue same medication except and ezetimibe since she is no longer able to take statins.  Recheck in 4 months for office visit and fasting lab work

## 2013-01-16 NOTE — Assessment & Plan Note (Signed)
Blood pressure was remaining stable on current therapy.  Systolic slightly high today she will check periodically at home

## 2013-01-25 ENCOUNTER — Ambulatory Visit
Admission: RE | Admit: 2013-01-25 | Discharge: 2013-01-25 | Disposition: A | Discharge status: Home / Self Care | Payer: BC Managed Care – PPO | Source: Ambulatory Visit | Attending: Obstetrics and Gynecology | Admitting: Obstetrics and Gynecology

## 2013-01-25 DIAGNOSIS — R928 Other abnormal and inconclusive findings on diagnostic imaging of breast: Secondary | ICD-10-CM

## 2013-03-25 ENCOUNTER — Telehealth: Payer: Self-pay | Admitting: Cardiology

## 2013-03-25 NOTE — Telephone Encounter (Signed)
Pt states she has been having achy feeling in both legs and arms and right hip pain. She feels like it is from Zetia and has gotten so bad that she does not feel she can continue to take Zetia. She had similar symptoms from simvastatin but not as bad. She has some simvastatin left over, she would rather take simvastatin than Zetia. I will forward to Dr Patty Sermons for review.

## 2013-03-25 NOTE — Telephone Encounter (Signed)
It is probably from the Zetia. Stop Zetia now.

## 2013-03-25 NOTE — Telephone Encounter (Signed)
Pt advised.

## 2013-03-25 NOTE — Telephone Encounter (Signed)
New Problem  Pt states that she is having a lot of pain in her hips and she believes that its coming Zetia// please call

## 2013-04-03 ENCOUNTER — Telehealth: Payer: Self-pay | Admitting: Cardiology

## 2013-04-03 NOTE — Telephone Encounter (Signed)
New Problem  Pt call about a replacement for cholesterol medication// please advise

## 2013-04-03 NOTE — Telephone Encounter (Signed)
Better off the Zetia, pain in back and hip was severe. Has taken Simvastatin in the past and did fine. Stated Simvastatin d/c secondary to losing voice. Will forward to  Dr. Patty Sermons for review

## 2013-04-03 NOTE — Telephone Encounter (Signed)
Okay to try simvastatin 20 mg daily

## 2013-04-05 NOTE — Telephone Encounter (Signed)
Line busy will call back Monday

## 2013-04-05 NOTE — Telephone Encounter (Signed)
Follow up     Pt call pt back at home 10/17 or Monday at work

## 2013-04-08 NOTE — Telephone Encounter (Signed)
Advised patient

## 2013-04-22 ENCOUNTER — Other Ambulatory Visit: Payer: BC Managed Care – PPO

## 2013-05-20 ENCOUNTER — Encounter (INDEPENDENT_AMBULATORY_CARE_PROVIDER_SITE_OTHER): Payer: Self-pay

## 2013-05-20 ENCOUNTER — Other Ambulatory Visit (INDEPENDENT_AMBULATORY_CARE_PROVIDER_SITE_OTHER): Payer: BC Managed Care – PPO

## 2013-05-20 DIAGNOSIS — E7800 Pure hypercholesterolemia, unspecified: Secondary | ICD-10-CM

## 2013-05-20 LAB — LIPID PANEL
Cholesterol: 140 mg/dL (ref 0–200)
VLDL: 19.6 mg/dL (ref 0.0–40.0)

## 2013-05-20 LAB — BASIC METABOLIC PANEL
BUN: 17 mg/dL (ref 6–23)
Calcium: 9.5 mg/dL (ref 8.4–10.5)
Creatinine, Ser: 1 mg/dL (ref 0.4–1.2)
GFR: 60.72 mL/min (ref >60.00)

## 2013-05-20 LAB — HEPATIC FUNCTION PANEL
ALT: 12 U/L (ref 0–35)
AST: 13 U/L (ref 0–37)
Alkaline Phosphatase: 80 U/L (ref 39–117)
Bilirubin, Direct: 0 mg/dL (ref 0.0–0.3)
Total Protein: 6.9 g/dL (ref 6.0–8.3)

## 2013-05-20 NOTE — Progress Notes (Signed)
Quick Note:  Please make copy of labs for patient visit. ______ 

## 2013-05-21 ENCOUNTER — Encounter: Payer: Self-pay | Admitting: Cardiology

## 2013-05-21 ENCOUNTER — Ambulatory Visit (INDEPENDENT_AMBULATORY_CARE_PROVIDER_SITE_OTHER): Payer: BC Managed Care – PPO | Admitting: Cardiology

## 2013-05-21 VITALS — BP 126/80 | HR 70 | Ht 62.0 in | Wt 146.0 lb

## 2013-05-21 DIAGNOSIS — I2510 Atherosclerotic heart disease of native coronary artery without angina pectoris: Secondary | ICD-10-CM

## 2013-05-21 DIAGNOSIS — E7800 Pure hypercholesterolemia, unspecified: Secondary | ICD-10-CM

## 2013-05-21 DIAGNOSIS — I119 Hypertensive heart disease without heart failure: Secondary | ICD-10-CM

## 2013-05-21 NOTE — Patient Instructions (Signed)
Your physician recommends that you continue on your current medications as directed. Please refer to the Current Medication list given to you today.  Your physician wants you to follow-up in: 6 months with fasting labs (lp/bmet/hfp)  You will receive a reminder letter in the mail two months in advance. If you don't receive a letter, please call our office to schedule the follow-up appointment.  

## 2013-05-21 NOTE — Assessment & Plan Note (Signed)
The patient has lost 6 pounds since last visit intentionally.  She is watching her diet carefully.  She was unable to tolerate ezetimibe and she went back on her previous dose of simvastatin 40 mg daily which she is tolerating well without side effects.  Her blood work this time is satisfactory.  Continue same meds.

## 2013-05-21 NOTE — Assessment & Plan Note (Signed)
The patient has not been having any recurrent chest pain or angina. 

## 2013-05-21 NOTE — Assessment & Plan Note (Signed)
No symptoms of CHF.  No headaches or dizziness.  No palpitations.

## 2013-05-21 NOTE — Progress Notes (Signed)
[name] Date of Birth:  [birth year] [address] [place], [place]  [address] [phone]         Fax   [phone]  History of Present Illness: This pleasant 78 year old woman is seen for a scheduled followup office visit. She has a past history of ischemic heart disease and is status post CABG in 2009. At that time she presented with angina pectoris and a very positive stress test. Since surgery she has done well with no recurrent angina. The patient has a history of high cholesterol and a history of essential hypertension. The patient has a special needs son age 68 who lives at home. He was injured in a motorcycle accident at age 61. 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  During the summer she works from 7 AM until 6 PM 4 days a week and she is off on Fridays.  She is planning to retire from work at the end of this year.   Current Outpatient Prescriptions  Medication Sig Dispense Refill  . aspirin 81 MG tablet Take 81 mg by mouth daily.        Marland Kitchen losartan (COZAAR) 100 MG tablet Take 1 tablet (100 mg total) by mouth daily.  90 tablet  3  . metoprolol tartrate (LOPRESSOR) 25 MG tablet TAKE ONE TABLET BY MOUTH TWICE DAILY  180 tablet  3  . Multiple Vitamin (MULTIVITAMIN) tablet Take 1 tablet by mouth daily.        . simvastatin (ZOCOR) 40 MG tablet Take 40 mg by mouth daily.       No current facility-administered medications for this visit.    Allergies  Allergen Reactions  . Amiodarone     rash  . Crestor [Rosuvastatin Calcium]     Leg pain  . Ramipril     cough  . Simvastatin     Week hourse voice  . Zetia [Ezetimibe] Other (See Comments)    Muscle pain    Patient Active Problem List   Diagnosis Date Noted  . Coronary artery disease     Priority: Medium  . Benign hypertensive heart disease without heart failure 07/14/2011  . Hx of CABG 07/14/2011  . Hypercholesterolemia     . Nausea   . Myalgia     History  Smoking status  . Never Smoker   Smokeless tobacco  . Not on file    History  Alcohol Use No    Family History  Problem Relation Age of Onset  . Stroke Mother   . Cancer Father     Review of Systems: Constitutional: no fever chills diaphoresis or fatigue or change in weight.  Head and neck: no hearing loss, no epistaxis, no photophobia or visual disturbance. Respiratory: No cough, shortness of breath or wheezing. Cardiovascular: No chest pain peripheral edema, palpitations. Gastrointestinal: No abdominal distention, no abdominal pain, no change in bowel habits hematochezia or melena. Genitourinary: No dysuria, no frequency, no urgency, no nocturia. Musculoskeletal:No arthralgias, no back pain, no gait disturbance or myalgias. Neurological: No dizziness, no headaches, no numbness, no seizures, no syncope, no weakness, no tremors. Hematologic: No lymphadenopathy, no easy bruising. Psychiatric: No confusion, no hallucinations, no sleep disturbance.    Physical Exam: Filed Vitals:   05/21/13 1346  BP: 126/80  Pulse: 70   the general appearance reveals a well-developed well-nourished elderly woman in no distress.The head and neck exam reveals pupils  equal and reactive.  Extraocular movements are full.  There is no scleral icterus.  The mouth and pharynx are normal.  The neck is supple.  The carotids reveal no bruits.  The jugular venous pressure is normal.  The  thyroid is not enlarged.  There is no lymphadenopathy.  The chest is clear to percussion and auscultation.  There are no rales or rhonchi.  Expansion of the chest is symmetrical.  The precordium is quiet.  The first heart sound is normal.  The second heart sound is physiologically split.  There is no murmur gallop rub or click.  There is no abnormal lift or heave.  The abdomen is soft and nontender.  The bowel sounds are normal.  The liver and spleen are not enlarged.  There are no  abdominal masses.  There are no abdominal bruits.  Extremities reveal good pedal pulses.  There is no phlebitis or edema.  There is no cyanosis or clubbing.  Strength is normal and symmetrical in all extremities.  There is no lateralizing weakness.  There are no sensory deficits.  The skin is warm and dry.  There is no rash.     Assessment / Plan: Continue same medication..  Recheck in  6 months for office visit and fasting lab work.

## 2013-06-06 ENCOUNTER — Other Ambulatory Visit: Payer: Self-pay | Admitting: Cardiology

## 2013-06-10 ENCOUNTER — Other Ambulatory Visit: Payer: Self-pay | Admitting: Obstetrics and Gynecology

## 2013-06-18 ENCOUNTER — Telehealth: Payer: Self-pay | Admitting: Cardiology

## 2013-06-18 DIAGNOSIS — E7800 Pure hypercholesterolemia, unspecified: Secondary | ICD-10-CM

## 2013-06-18 MED ORDER — SIMVASTATIN 20 MG PO TABS: 20.0000 mg | ORAL_TABLET | Freq: Every day | ORAL | Status: DC

## 2013-06-18 NOTE — Telephone Encounter (Signed)
Agree with plan 

## 2013-06-18 NOTE — Telephone Encounter (Signed)
New Message  Pt called--- requesting a new script for 20 Mg of Simvastatin vs 40 mg. Please call back to discuss.

## 2013-06-18 NOTE — Telephone Encounter (Signed)
Patient called and has been having cramping in her legs at night. In October she was advised to d/c Zetia and start Zocor 20 mg daily. She only had 40 mg at home so she started on those instead but would like to change to the Simvastatin 20 mg daily. Sent new Rx for 20 mg tablets to pharmacy. Scheduled follow up labs in 2 months

## 2013-06-28 ENCOUNTER — Other Ambulatory Visit: Payer: Self-pay | Admitting: Obstetrics and Gynecology

## 2013-06-28 DIAGNOSIS — N63 Unspecified lump in unspecified breast: Secondary | ICD-10-CM

## 2013-07-15 ENCOUNTER — Ambulatory Visit
Admission: RE | Admit: 2013-07-15 | Discharge: 2013-07-15 | Disposition: A | Discharge status: Home / Self Care | Payer: BC Managed Care – PPO | Source: Ambulatory Visit | Attending: Obstetrics and Gynecology | Admitting: Obstetrics and Gynecology

## 2013-07-15 DIAGNOSIS — N63 Unspecified lump in unspecified breast: Secondary | ICD-10-CM

## 2013-08-21 ENCOUNTER — Other Ambulatory Visit (INDEPENDENT_AMBULATORY_CARE_PROVIDER_SITE_OTHER): Payer: Medicare Other

## 2013-08-21 DIAGNOSIS — E7800 Pure hypercholesterolemia, unspecified: Secondary | ICD-10-CM

## 2013-08-21 LAB — LIPID PANEL
CHOL/HDL RATIO: 3
Cholesterol: 141 mg/dL (ref 0–200)
HDL: 45.9 mg/dL (ref >39.00)
LDL CALC: 77 mg/dL (ref 0–99)
TRIGLYCERIDES: 92 mg/dL (ref 0.0–149.0)
VLDL: 18.4 mg/dL (ref 0.0–40.0)

## 2013-08-21 LAB — HEPATIC FUNCTION PANEL
ALBUMIN: 3.5 g/dL (ref 3.5–5.2)
ALT: 19 U/L (ref 0–35)
AST: 20 U/L (ref 0–37)
Alkaline Phosphatase: 80 U/L (ref 39–117)
BILIRUBIN TOTAL: 0.8 mg/dL (ref 0.3–1.2)
Bilirubin, Direct: 0.1 mg/dL (ref 0.0–0.3)
Total Protein: 6.3 g/dL (ref 6.0–8.3)

## 2013-08-22 NOTE — Progress Notes (Signed)
Quick Note:  Please report to patient. The recent labs are stable. Continue same medication and careful diet. ______ 

## 2013-08-23 ENCOUNTER — Telehealth: Payer: Self-pay | Admitting: *Deleted

## 2013-08-23 NOTE — Telephone Encounter (Signed)
Advised patient of lab results  

## 2013-08-23 NOTE — Telephone Encounter (Signed)
Message copied by Burnell BlanksPRATT, Alexsandro Salek B on Fri Aug 23, 2013  1:46 PM ------      Message from: Cassell ClementBRACKBILL, THOMAS      Created: Thu Aug 22, 2013  8:52 PM       Please report to patient.  The recent labs are stable. Continue same medication and careful diet. ------

## 2013-11-22 ENCOUNTER — Ambulatory Visit: Payer: Self-pay | Admitting: Cardiology

## 2013-12-09 ENCOUNTER — Other Ambulatory Visit: Payer: Self-pay | Admitting: Cardiology

## 2013-12-10 ENCOUNTER — Other Ambulatory Visit: Payer: Self-pay | Admitting: Cardiology

## 2014-01-07 ENCOUNTER — Other Ambulatory Visit (INDEPENDENT_AMBULATORY_CARE_PROVIDER_SITE_OTHER): Payer: Medicare Other

## 2014-01-07 DIAGNOSIS — E7800 Pure hypercholesterolemia, unspecified: Secondary | ICD-10-CM

## 2014-01-07 DIAGNOSIS — I119 Hypertensive heart disease without heart failure: Secondary | ICD-10-CM

## 2014-01-07 LAB — HEPATIC FUNCTION PANEL
ALT: 17 U/L (ref 0–35)
AST: 17 U/L (ref 0–37)
Albumin: 3.4 g/dL — ABNORMAL LOW (ref 3.5–5.2)
Alkaline Phosphatase: 82 U/L (ref 39–117)
BILIRUBIN TOTAL: 0.5 mg/dL (ref 0.2–1.2)
Bilirubin, Direct: 0 mg/dL (ref 0.0–0.3)
TOTAL PROTEIN: 6.3 g/dL (ref 6.0–8.3)

## 2014-01-07 LAB — BASIC METABOLIC PANEL
BUN: 18 mg/dL (ref 6–23)
CALCIUM: 9.1 mg/dL (ref 8.4–10.5)
CO2: 31 mEq/L (ref 19–32)
Chloride: 104 mEq/L (ref 96–112)
Creatinine, Ser: 0.9 mg/dL (ref 0.4–1.2)
GFR: 66.22 mL/min (ref >60.00)
GLUCOSE: 95 mg/dL (ref 70–99)
Potassium: 3.7 mEq/L (ref 3.5–5.1)
SODIUM: 139 meq/L (ref 135–145)

## 2014-01-07 LAB — LIPID PANEL
CHOLESTEROL: 146 mg/dL (ref 0–200)
HDL: 47.7 mg/dL (ref >39.00)
LDL Cholesterol: 79 mg/dL (ref 0–99)
NonHDL: 98.3
TRIGLYCERIDES: 96 mg/dL (ref 0.0–149.0)
Total CHOL/HDL Ratio: 3
VLDL: 19.2 mg/dL (ref 0.0–40.0)

## 2014-01-07 NOTE — Progress Notes (Signed)
Quick Note:  Please make copy of labs for patient visit. ______ 

## 2014-01-07 NOTE — Progress Notes (Signed)
Quick Note:  Preliminary report reviewed by triage nurse and sent to MD desk. ______ 

## 2014-01-10 ENCOUNTER — Ambulatory Visit (INDEPENDENT_AMBULATORY_CARE_PROVIDER_SITE_OTHER): Payer: Medicare Other | Admitting: Cardiology

## 2014-01-10 ENCOUNTER — Encounter: Payer: Self-pay | Admitting: Cardiology

## 2014-01-10 VITALS — BP 128/70 | HR 57 | Ht 62.0 in | Wt 142.0 lb

## 2014-01-10 DIAGNOSIS — E7800 Pure hypercholesterolemia, unspecified: Secondary | ICD-10-CM

## 2014-01-10 DIAGNOSIS — I119 Hypertensive heart disease without heart failure: Secondary | ICD-10-CM

## 2014-01-10 DIAGNOSIS — I259 Chronic ischemic heart disease, unspecified: Secondary | ICD-10-CM

## 2014-01-10 NOTE — Assessment & Plan Note (Signed)
The patient is not having a palpitations.  No dizziness or syncope.  Blood pressure has been remaining normal on current ARB and beta blocker

## 2014-01-10 NOTE — Progress Notes (Signed)
[name] Date of Birth:  [birth year] [facility] [address] [place], [place]  [address] [phone]        Fax   [phone]   History of Present Illness: This pleasant 79 year old woman is seen for a scheduled followup office visit. She has a past history of ischemic heart disease and is status post CABG in 2009. At that time she presented with angina pectoris and a very positive stress test. Since surgery she has done well with no recurrent angina. The patient has a history of high cholesterol and a history of essential hypertension. The patient has a special needs son age 4 who lives at home. He was injured in a motorcycle accident at age 43.  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  She is enjoying retirement.  She stays busy doing household chores and working in the yard which she enjoys.  Her son is able to help her with the yard work.   Current Outpatient Prescriptions  Medication Sig Dispense Refill  . aspirin 81 MG tablet Take 81 mg by mouth daily.        Marland Kitchen losartan (COZAAR) 100 MG tablet TAKE ONE TABLET BY MOUTH ONCE DAILY.  90 tablet  0  . metoprolol tartrate (LOPRESSOR) 25 MG tablet TAKE ONE TABLET BY MOUTH TWICE DAILY  180 tablet  3  . Multiple Vitamin (MULTIVITAMIN) tablet Take 1 tablet by mouth daily.        . simvastatin (ZOCOR) 20 MG tablet Take 1 tablet (20 mg total) by mouth at bedtime.  90 tablet  3   No current facility-administered medications for this visit.    Allergies  Allergen Reactions  . Amiodarone     rash  . Crestor [Rosuvastatin Calcium]     Leg pain  . Ramipril     cough  . Simvastatin     Week hourse voice  . Zetia [Ezetimibe] Other (See Comments)    Muscle pain    Patient Active Problem List   Diagnosis Date Noted  . Coronary artery disease     Priority: Medium  . Benign hypertensive heart disease without heart failure 07/14/2011  . Hx of CABG 07/14/2011  . Hypercholesterolemia    . Nausea   . Myalgia     History  Smoking status  . Never Smoker   Smokeless tobacco  . Not on file    History  Alcohol Use No    Family History  Problem Relation Age of Onset  . Stroke Mother   . Cancer Father     Review of Systems: Constitutional: no fever chills diaphoresis or fatigue or change in weight.  Head and neck: no hearing loss, no epistaxis, no photophobia or visual disturbance. Respiratory: No cough, shortness of breath or wheezing. Cardiovascular: No chest pain peripheral edema, palpitations. Gastrointestinal: No abdominal distention, no abdominal pain, no change in bowel habits hematochezia or melena. Genitourinary: No dysuria, no frequency, no urgency, no nocturia. Musculoskeletal:No arthralgias, no back pain, no gait disturbance or myalgias. Neurological: No dizziness, no headaches, no numbness, no seizures, no syncope, no weakness, no tremors. Hematologic: No lymphadenopathy, no easy bruising. Psychiatric: No confusion, no hallucinations, no sleep disturbance.    Physical Exam: Filed Vitals:   01/10/14 0920  BP: 128/70  Pulse: 57   the general appearance reveals a well-developed well-nourished woman who appears to be younger than her stated age.The head and neck exam reveals pupils equal  and reactive.  Extraocular movements are full.  There is no scleral icterus.  The mouth and pharynx are normal.  The neck is supple.  The carotids reveal no bruits.  The jugular venous pressure is normal.  The  thyroid is not enlarged.  There is no lymphadenopathy.  The chest is clear to percussion and auscultation.  There are no rales or rhonchi.  Expansion of the chest is symmetrical.  The precordium is quiet.  The first heart sound is normal.  The second heart sound is physiologically split.  There is no murmur gallop rub or click.  There is no abnormal lift or heave.  The abdomen is soft and nontender.  The bowel sounds are normal.  The liver and spleen are not  enlarged.  There are no abdominal masses.  There are no abdominal bruits.  Extremities reveal good pedal pulses.  There is no phlebitis or edema.  There is no cyanosis or clubbing.  Strength is normal and symmetrical in all extremities.  There is no lateralizing weakness.  There are no sensory deficits.  The skin is warm and dry.  There is no rash.     Assessment / Plan: 1.  Ischemic heart disease status post CABG in 2009 2. essential hypertension without heart failure 3. hypercholesterolemia  Plan: Continue same medication.  Recheck in 6 months for office visit EKG lipid panel hepatic function panel and basal metabolic panel.

## 2014-01-10 NOTE — Assessment & Plan Note (Signed)
The patient is doing well on low-dose simvastatin.  No myalgias.

## 2014-01-10 NOTE — Assessment & Plan Note (Signed)
The patient has not been experiencing any recurrent chest pain or angina.  No symptoms of CHF.

## 2014-01-10 NOTE — Patient Instructions (Signed)
Your physician recommends that you continue on your current medications as directed. Please refer to the Current Medication list given to you today.  Your physician wants you to follow-up in: 6 months with fasting labs (lp/bmet/hfp) and ekg  You will receive a reminder letter in the mail two months in advance. If you don't receive a letter, please call our office to schedule the follow-up appointment.  

## 2014-03-20 ENCOUNTER — Other Ambulatory Visit: Payer: Self-pay | Admitting: Cardiology

## 2014-03-21 ENCOUNTER — Other Ambulatory Visit: Payer: Self-pay | Admitting: *Deleted

## 2014-03-21 MED ORDER — LOSARTAN POTASSIUM 100 MG PO TABS: 100.0000 mg | ORAL_TABLET | Freq: Every day | ORAL | Status: DC

## 2014-06-17 ENCOUNTER — Other Ambulatory Visit: Payer: Self-pay | Admitting: Cardiology

## 2014-08-06 ENCOUNTER — Other Ambulatory Visit: Payer: Self-pay | Admitting: Obstetrics and Gynecology

## 2014-08-06 DIAGNOSIS — N6310 Unspecified lump in the right breast, unspecified quadrant: Secondary | ICD-10-CM

## 2014-08-14 ENCOUNTER — Ambulatory Visit
Admission: RE | Admit: 2014-08-14 | Discharge: 2014-08-14 | Disposition: A | Discharge status: Home / Self Care | Payer: Medicare Other | Source: Ambulatory Visit | Attending: Obstetrics and Gynecology | Admitting: Obstetrics and Gynecology

## 2014-08-14 ENCOUNTER — Encounter (INDEPENDENT_AMBULATORY_CARE_PROVIDER_SITE_OTHER): Payer: Self-pay

## 2014-08-14 DIAGNOSIS — N6310 Unspecified lump in the right breast, unspecified quadrant: Secondary | ICD-10-CM

## 2014-08-25 ENCOUNTER — Other Ambulatory Visit: Payer: Self-pay

## 2014-08-27 ENCOUNTER — Ambulatory Visit: Payer: Self-pay | Admitting: Cardiology

## 2014-09-16 ENCOUNTER — Other Ambulatory Visit: Payer: Self-pay | Admitting: Cardiology

## 2014-09-24 ENCOUNTER — Other Ambulatory Visit (INDEPENDENT_AMBULATORY_CARE_PROVIDER_SITE_OTHER): Payer: Medicare Other | Admitting: *Deleted

## 2014-09-24 DIAGNOSIS — I119 Hypertensive heart disease without heart failure: Secondary | ICD-10-CM

## 2014-09-24 DIAGNOSIS — E7800 Pure hypercholesterolemia, unspecified: Secondary | ICD-10-CM

## 2014-09-24 LAB — HEPATIC FUNCTION PANEL
ALBUMIN: 3.8 g/dL (ref 3.5–5.2)
ALT: 17 U/L (ref 0–35)
AST: 18 U/L (ref 0–37)
Alkaline Phosphatase: 85 U/L (ref 39–117)
Bilirubin, Direct: 0.1 mg/dL (ref 0.0–0.3)
Total Bilirubin: 0.5 mg/dL (ref 0.2–1.2)
Total Protein: 6.9 g/dL (ref 6.0–8.3)

## 2014-09-24 LAB — LIPID PANEL
Cholesterol: 150 mg/dL (ref 0–200)
HDL: 47.6 mg/dL (ref >39.00)
LDL Cholesterol: 79 mg/dL (ref 0–99)
NONHDL: 102.4
Total CHOL/HDL Ratio: 3
Triglycerides: 116 mg/dL (ref 0.0–149.0)
VLDL: 23.2 mg/dL (ref 0.0–40.0)

## 2014-09-24 LAB — BASIC METABOLIC PANEL
BUN: 17 mg/dL (ref 6–23)
CALCIUM: 9.4 mg/dL (ref 8.4–10.5)
CHLORIDE: 104 meq/L (ref 96–112)
CO2: 27 mEq/L (ref 19–32)
CREATININE: 0.94 mg/dL (ref 0.40–1.20)
GFR: 61.25 mL/min (ref >60.00)
Glucose, Bld: 107 mg/dL — ABNORMAL HIGH (ref 70–99)
POTASSIUM: 4 meq/L (ref 3.5–5.1)
SODIUM: 138 meq/L (ref 135–145)

## 2014-09-24 NOTE — Addendum Note (Signed)
Addended by: Tonita PhoenixBOWDEN, ROBIN K on: 09/24/2014 11:34 AM   Modules accepted: Orders

## 2014-09-25 NOTE — Progress Notes (Signed)
Quick Note:  Please make copy of labs for patient visit. ______ 

## 2014-09-26 ENCOUNTER — Encounter: Payer: Self-pay | Admitting: Cardiology

## 2014-09-26 ENCOUNTER — Ambulatory Visit (INDEPENDENT_AMBULATORY_CARE_PROVIDER_SITE_OTHER): Payer: Medicare Other | Admitting: Cardiology

## 2014-09-26 VITALS — BP 114/86 | HR 53 | Ht 62.0 in | Wt 146.4 lb

## 2014-09-26 DIAGNOSIS — E7800 Pure hypercholesterolemia, unspecified: Secondary | ICD-10-CM

## 2014-09-26 DIAGNOSIS — I259 Chronic ischemic heart disease, unspecified: Secondary | ICD-10-CM

## 2014-09-26 DIAGNOSIS — I119 Hypertensive heart disease without heart failure: Secondary | ICD-10-CM

## 2014-09-26 MED ORDER — LOSARTAN POTASSIUM 100 MG PO TABS: 100.0000 mg | ORAL_TABLET | Freq: Every day | ORAL | Status: DC

## 2014-09-26 MED ORDER — SIMVASTATIN 20 MG PO TABS: 20.0000 mg | ORAL_TABLET | Freq: Every day | ORAL | Status: DC

## 2014-09-26 NOTE — Patient Instructions (Signed)
Medication Instructions:  Your physician recommends that you continue on your current medications as directed. Please refer to the Current Medication list given to you today.  Labwork: none  Testing/Procedures: none  Follow-Up: Your physician wants you to follow-up in: 6 months with fasting labs (lp/bmet/hfp/cbc)  You will receive a reminder letter in the mail two months in advance. If you don't receive a letter, please call our office to schedule the follow-up appointment.      

## 2014-09-26 NOTE — Progress Notes (Signed)
Cardiology Office Note   Date:  09/26/2014   ID:  Christine GasmanBetty H Insco, DOB 01/09/1935, MRN [id number]  PCP:  Mickie HillierLITTLE,KEVIN LORNE, MD  Cardiologist:   Cassell Clementhomas Kamalei Roeder, MD   No chief complaint on file.  This pleasant 80 year old woman is seen for a scheduled followup office visit. She has a past history of ischemic heart disease and is status post CABG in 2009. At that time she presented with angina pectoris and a very positive stress test. Since surgery she has done well with no recurrent angina. The patient has a history of high cholesterol and a history of essential hypertension. The patient has a special needs son age 78 who lives at home. He was injured in a motorcycle accident at age 78. 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 Her son who is disabled was hospitalized for 15 days in February for back surgery and rehabilitation but is now back home and is doing well.     Past Medical History  Diagnosis Date  . Hypertension     ESSENTIAL  . Hypercholesterolemia   . Coronary artery disease   . Nausea   . Myalgia     Past Surgical History  Procedure Laterality Date  . Coronary artery bypass graft       Current Outpatient Prescriptions  Medication Sig Dispense Refill  . aspirin 81 MG tablet Take 81 mg by mouth daily.      Marland Kitchen. losartan (COZAAR) 100 MG tablet Take 1 tablet (100 mg total) by mouth daily. 90 tablet 3  . metoprolol tartrate (LOPRESSOR) 25 MG tablet TAKE ONE TABLET BY MOUTH TWICE DAILY 180 tablet 0  . Multiple Vitamin (MULTIVITAMIN) tablet Take 1 tablet by mouth daily.      . simvastatin (ZOCOR) 20 MG tablet Take 1 tablet (20 mg total) by mouth at bedtime. 90 tablet 3   No current facility-administered medications for this visit.    Allergies:   Amiodarone; Crestor; Ramipril; Simvastatin; and Zetia    Social History:  The patient  reports that she has never smoked. She does not have any smokeless tobacco  history on file. She reports that she does not drink alcohol or use illicit drugs.   Family History:  The patient's family history includes Cancer in her father; Dementia in her sister and sister; Hypertension in her sister; Stroke in her mother; Uterine cancer in her sister.    ROS:  Please see the history of present illness.   Otherwise, review of systems are positive for none.   All other systems are reviewed and negative.    PHYSICAL EXAM: VS:  BP 114/86 mmHg  Pulse 53  Ht 5\' 2"  (1.575 m)  Wt 146 lb 6.4 oz (66.407 kg)  BMI 26.77 kg/m2 , BMI Body mass index is 26.77 kg/(m^2). GEN: Well nourished, well developed, in no acute distress HEENT: normal Neck: no JVD, carotid bruits, or masses Cardiac: RRR; no murmurs, rubs, or gallops,no edema  Respiratory:  clear to auscultation bilaterally, normal work of breathing GI: soft, nontender, nondistended, + BS MS: no deformity or atrophy Skin: warm and dry, no rash Neuro:  Strength and sensation are intact Psych: euthymic mood, full affect   EKG:  EKG is ordered today. The ekg ordered today demonstrates sinus bradycardia.  Within normal limits.   Recent Labs: 09/24/2014: ALT 17; BUN 17; Creatinine 0.94; Potassium 4.0; Sodium 138    Lipid Panel    Component Value  Date/Time   CHOL 150 09/24/2014 1135   TRIG 116.0 09/24/2014 1135   HDL 47.60 09/24/2014 1135   CHOLHDL 3 09/24/2014 1135   VLDL 23.2 09/24/2014 1135   LDLCALC 79 09/24/2014 1135      Wt Readings from Last 3 Encounters:  09/26/14 146 lb 6.4 oz (66.407 kg)  01/10/14 142 lb (64.411 kg)  05/21/13 146 lb (66.225 kg)      Other studies Reviewed:    ASSESSMENT AND PLAN:  1. Ischemic heart disease status post CABG in 2009 2. essential hypertension without heart failure 3. hypercholesterolemia  Plan: Continue same medication. Recheck in 6 months for office visit  lipid panel hepatic function panel and basal metabolic panel.  And CBC   Current medicines are  reviewed at length with the patient today.  The patient does not have concerns regarding medicines.  The following changes have been made:  no change  Labs/ tests ordered today include:   Orders Placed This Encounter  Procedures  . Lipid panel  . Hepatic function panel  . Basic metabolic panel  . CBC with Differential/Platelet  . EKG 12-Lead       Signed, Cassell Clement, MD  09/26/2014 6:17 PM    [facility] [address] [place], [place], [place]  [address] Phone: [phone]; Fax: [phone]

## 2014-12-17 ENCOUNTER — Other Ambulatory Visit: Payer: Self-pay | Admitting: Cardiology

## 2014-12-18 ENCOUNTER — Other Ambulatory Visit: Payer: Self-pay | Admitting: Cardiology

## 2015-04-28 ENCOUNTER — Other Ambulatory Visit: Payer: Medicare Other

## 2015-05-01 ENCOUNTER — Ambulatory Visit: Payer: Medicare Other | Admitting: Cardiology

## 2015-07-06 ENCOUNTER — Other Ambulatory Visit (INDEPENDENT_AMBULATORY_CARE_PROVIDER_SITE_OTHER): Payer: Medicare Other | Admitting: *Deleted

## 2015-07-06 DIAGNOSIS — E7800 Pure hypercholesterolemia, unspecified: Secondary | ICD-10-CM | POA: Diagnosis not present

## 2015-07-06 DIAGNOSIS — I2510 Atherosclerotic heart disease of native coronary artery without angina pectoris: Secondary | ICD-10-CM

## 2015-07-06 DIAGNOSIS — I2583 Coronary atherosclerosis due to lipid rich plaque: Secondary | ICD-10-CM

## 2015-07-06 DIAGNOSIS — I119 Hypertensive heart disease without heart failure: Secondary | ICD-10-CM

## 2015-07-06 LAB — CBC WITH DIFFERENTIAL/PLATELET
Basophils Absolute: 0.1 10*3/uL (ref 0.0–0.1)
Basophils Relative: 1 % (ref 0–1)
Eosinophils Absolute: 0.1 10*3/uL (ref 0.0–0.7)
Eosinophils Relative: 1 % (ref 0–5)
HEMATOCRIT: 41.8 % (ref 36.0–46.0)
HEMOGLOBIN: 14.3 g/dL (ref 12.0–15.0)
LYMPHS PCT: 31 % (ref 12–46)
Lymphs Abs: 2.3 10*3/uL (ref 0.7–4.0)
MCH: 30.8 pg (ref 26.0–34.0)
MCHC: 34.2 g/dL (ref 30.0–36.0)
MCV: 89.9 fL (ref 78.0–100.0)
MONOS PCT: 8 % (ref 3–12)
MPV: 9.5 fL (ref 8.6–12.4)
Monocytes Absolute: 0.6 10*3/uL (ref 0.1–1.0)
NEUTROS ABS: 4.4 10*3/uL (ref 1.7–7.7)
NEUTROS PCT: 59 % (ref 43–77)
Platelets: 285 10*3/uL (ref 150–400)
RBC: 4.65 MIL/uL (ref 3.87–5.11)
RDW: 13.7 % (ref 11.5–15.5)
WBC: 7.4 10*3/uL (ref 4.0–10.5)

## 2015-07-06 LAB — BASIC METABOLIC PANEL
BUN: 19 mg/dL (ref 7–25)
CALCIUM: 9.4 mg/dL (ref 8.6–10.4)
CHLORIDE: 102 mmol/L (ref 98–110)
CO2: 28 mmol/L (ref 20–31)
CREATININE: 0.94 mg/dL — AB (ref 0.60–0.93)
Glucose, Bld: 97 mg/dL (ref 65–99)
Potassium: 4.1 mmol/L (ref 3.5–5.3)
Sodium: 140 mmol/L (ref 135–146)

## 2015-07-06 LAB — LIPID PANEL
Cholesterol: 214 mg/dL — ABNORMAL HIGH (ref 125–200)
HDL: 43 mg/dL — AB (ref >=46)
LDL CALC: 141 mg/dL — AB (ref <130)
Total CHOL/HDL Ratio: 5 Ratio (ref <=5.0)
Triglycerides: 150 mg/dL — ABNORMAL HIGH (ref <150)
VLDL: 30 mg/dL (ref <30)

## 2015-07-06 LAB — HEPATIC FUNCTION PANEL
ALBUMIN: 3.6 g/dL (ref 3.6–5.1)
ALK PHOS: 73 U/L (ref 33–130)
ALT: 16 U/L (ref 6–29)
AST: 18 U/L (ref 10–35)
BILIRUBIN DIRECT: 0.1 mg/dL (ref <=0.2)
BILIRUBIN TOTAL: 0.5 mg/dL (ref 0.2–1.2)
Indirect Bilirubin: 0.4 mg/dL (ref 0.2–1.2)
Total Protein: 6.6 g/dL (ref 6.1–8.1)

## 2015-07-06 NOTE — Addendum Note (Signed)
Addended by: Tonita PhoenixBOWDEN, ROBIN K on: 07/06/2015 10:10 AM   Modules accepted: Orders

## 2015-07-07 NOTE — Progress Notes (Signed)
Quick Note:  Please make copy of labs for patient visit. ______ 

## 2015-07-08 ENCOUNTER — Ambulatory Visit (INDEPENDENT_AMBULATORY_CARE_PROVIDER_SITE_OTHER): Payer: Medicare Other | Admitting: Cardiology

## 2015-07-08 ENCOUNTER — Encounter: Payer: Self-pay | Admitting: Cardiology

## 2015-07-08 VITALS — BP 130/86 | HR 68 | Ht 62.0 in | Wt 143.2 lb

## 2015-07-08 DIAGNOSIS — I259 Chronic ischemic heart disease, unspecified: Secondary | ICD-10-CM

## 2015-07-08 DIAGNOSIS — E7800 Pure hypercholesterolemia, unspecified: Secondary | ICD-10-CM

## 2015-07-08 DIAGNOSIS — I119 Hypertensive heart disease without heart failure: Secondary | ICD-10-CM

## 2015-07-08 MED ORDER — SIMVASTATIN 10 MG PO TABS: 10.0000 mg | ORAL_TABLET | Freq: Every day | ORAL | Status: DC

## 2015-07-08 NOTE — Progress Notes (Signed)
Cardiology Office Note   Date:  07/08/2015   ID:  [name], DOB Sep 01, 1934, MRN [id number]  PCP:  Mickie Hillier, MD  Cardiologist: Cassell Clement MD  Chief Complaint  Patient presents with  . routine follow up    Patient denies any chest pain, shortness of breath, le edema, and/or claudication      History of Present Illness: [name] is a 81 y.o. female who presents for scheduled follow-up visit   . She has a past history of ischemic heart disease and is status post CABG in 2009. At that time she presented with angina pectoris and a very positive stress test. Since surgery she has done well with no recurrent angina. The patient has a history of high cholesterol and a history of essential hypertension. The patient has a special needs son age 14 who lives at home. He was injured in a motorcycle accident at age 48. 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 Her son who is disabled was hospitalized for 15 days in February for back surgery and rehabilitation but is now back home and is doing well. Since we last saw the patient she stopped taking her simvastatin 20 mg tablet daily because she developed a sore throat and thought that the simvastatin had caused that.  We reviewed her recent labs which show significant deterioration in her lipid profile since stopping the statin therapy.  Past Medical History  Diagnosis Date  . Hypertension     ESSENTIAL  . Hypercholesterolemia   . Coronary artery disease   . Nausea   . Myalgia     Past Surgical History  Procedure Laterality Date  . Coronary artery bypass graft       Current Outpatient Prescriptions  Medication Sig Dispense Refill  . aspirin 81 MG tablet Take 81 mg by mouth daily.      Marland Kitchen losartan (COZAAR) 100 MG tablet Take 1 tablet (100 mg total) by mouth daily. 90 tablet 3  . metoprolol tartrate (LOPRESSOR) 25 MG tablet TAKE ONE TABLET BY MOUTH TWICE  DAILY 180 tablet 0  . Multiple Vitamin (MULTIVITAMIN) tablet Take 1 tablet by mouth daily.      . simvastatin (ZOCOR) 10 MG tablet Take 1 tablet (10 mg total) by mouth at bedtime. 90 tablet 3   No current facility-administered medications for this visit.    Allergies:   Amiodarone; Crestor; Ramipril; Simvastatin; and Zetia    Social History:  The patient  reports that she has never smoked. She does not have any smokeless tobacco history on file. She reports that she does not drink alcohol or use illicit drugs.   Family History:  The patient's family history includes Cancer in her father; Dementia in her sister and sister; Hypertension in her sister; Stroke in her mother; Uterine cancer in her sister.    ROS:  Please see the history of present illness.   Otherwise, review of systems are positive for none.   All other systems are reviewed and negative.    PHYSICAL EXAM: VS:  BP 130/86 mmHg  Pulse 68  Ht  (1.575 m)  Wt 143 lb 3.2 oz (64.955 kg)  BMI 26.18 kg/m2 , BMI Body mass index is 26.18 kg/(m^2). GEN: Well nourished, well developed, in no acute distress HEENT: normal Neck: no JVD, carotid bruits, or masses Cardiac: RRR; no murmurs, rubs, or gallops,no edema  Respiratory:  clear to auscultation bilaterally, normal  work of breathing GI: soft, nontender, nondistended, + BS MS: no deformity or atrophy Skin: warm and dry, no rash Neuro:  Strength and sensation are intact Psych: euthymic mood, full affect   EKG:  EKG is not ordered today. The ekg ordered today demonstrates    Recent Labs: 07/06/2015: ALT 16; BUN 19; Creat 0.94*; Hemoglobin 14.3; Platelets 285; Potassium 4.1; Sodium 140    Lipid Panel    Component Value Date/Time   CHOL 214* 07/06/2015 1010   TRIG 150* 07/06/2015 1010   HDL 43* 07/06/2015 1010   CHOLHDL 5.0 07/06/2015 1010   VLDL 30 07/06/2015 1010   LDLCALC 141* 07/06/2015 1010      Wt Readings from Last 3 Encounters:  07/08/15 143 lb 3.2 oz  (64.955 kg)  09/26/14 146 lb 6.4 oz (66.407 kg)  01/10/14 142 lb (64.411 kg)        ASSESSMENT AND PLAN:  1. Ischemic heart disease status post CABG in 2009 2. essential hypertension without heart failure 3. hypercholesterolemia   Current medicines are reviewed at length with the patient today.  The patient does not have concerns regarding medicines.  The following changes have been made:  no change  Labs/ tests ordered today include:  No orders of the defined types were placed in this encounter.     Disposition: We are going to have her restart simvastatin and a lower dose of 10 mg daily.  She would prefer to try simvastatin again rather than to go on something else such as rosuvastatin which she has also tried in the past.  She will let us know if the sore throat recurs on the lower dose of simvastatin. Recheck in 6 months for follow-up office visit and EKG and fasting lab work with Dr. Rennis Golden  Signed, Cassell Clement MD 07/08/2015 7:17 PM    [facility] [address] [place], [place], [place]  [address] Phone: [phone]; Fax: [phone]

## 2015-07-08 NOTE — Patient Instructions (Signed)
Medication Instructions:  DECREASE YOUR SIMVASTATIN TO 10 MG DAILY   Labwork: NONE  Testing/Procedures: NONE  Follow-Up: Your physician wants you to follow-up in: 6 months with fasting labs (lp/bmet/hfp) AND EKG WITH DR HILTY AT THE [facility] OFFICE You will receive a reminder letter in the mail two months in advance. If you don't receive a letter, please call our office to schedule the follow-up appointment.  If you need a refill on your cardiac medications before your next appointment, please call your pharmacy.

## 2015-10-19 ENCOUNTER — Other Ambulatory Visit: Payer: Self-pay | Admitting: *Deleted

## 2015-10-19 MED ORDER — LOSARTAN POTASSIUM 100 MG PO TABS: 100.0000 mg | ORAL_TABLET | Freq: Every day | ORAL | Status: DC

## 2015-11-13 ENCOUNTER — Telehealth: Payer: Self-pay | Admitting: Internal Medicine

## 2015-11-13 ENCOUNTER — Other Ambulatory Visit: Payer: Self-pay

## 2015-11-13 DIAGNOSIS — Z79899 Other long term (current) drug therapy: Secondary | ICD-10-CM

## 2015-11-13 DIAGNOSIS — E785 Hyperlipidemia, unspecified: Secondary | ICD-10-CM

## 2015-11-13 DIAGNOSIS — Z1231 Encounter for screening mammogram for malignant neoplasm of breast: Secondary | ICD-10-CM

## 2015-11-13 NOTE — Telephone Encounter (Signed)
Returned call to pt. She wants to know if Dr Rennis GoldenHilty will order her to get blood work prior to her July appt. I advised pt that I would forward this to Dr Rennis GoldenHilty to see if there are any labs he wants to order prior to her appt.  Dr Rennis GoldenHilty please advise.

## 2015-11-13 NOTE — Telephone Encounter (Signed)
°  New Prob   Pt is a former pt of Dr. Patty SermonsBrackbill transitioning over to Dr. Rennis GoldenHilty. Wanting blood work done for July appointment. Orders needed in EPIC. Please call.

## 2015-11-13 NOTE — Telephone Encounter (Signed)
Looks like Brackbill ordered a CMET and lipid. Go ahead and order those, fasting.  Dr. HRexene Edison

## 2015-11-17 NOTE — Telephone Encounter (Signed)
Labs ordered. LM for patient that MD ordered labs and that the lab slips would be mailed to her.

## 2015-11-25 ENCOUNTER — Ambulatory Visit
Admission: RE | Admit: 2015-11-25 | Discharge: 2015-11-25 | Disposition: A | Discharge status: Home / Self Care | Payer: Medicare Other | Source: Ambulatory Visit

## 2015-11-25 DIAGNOSIS — Z1231 Encounter for screening mammogram for malignant neoplasm of breast: Secondary | ICD-10-CM

## 2015-12-23 ENCOUNTER — Other Ambulatory Visit: Payer: Self-pay | Admitting: *Deleted

## 2015-12-23 MED ORDER — METOPROLOL TARTRATE 25 MG PO TABS: 25.0000 mg | ORAL_TABLET | Freq: Two times a day (BID) | ORAL | Status: DC

## 2015-12-26 LAB — COMPREHENSIVE METABOLIC PANEL
ALBUMIN: 3.9 g/dL (ref 3.6–5.1)
ALT: 15 U/L (ref 6–29)
AST: 15 U/L (ref 10–35)
Alkaline Phosphatase: 84 U/L (ref 33–130)
BUN: 19 mg/dL (ref 7–25)
CALCIUM: 9.2 mg/dL (ref 8.6–10.4)
CHLORIDE: 103 mmol/L (ref 98–110)
CO2: 26 mmol/L (ref 20–31)
CREATININE: 0.88 mg/dL (ref 0.60–0.93)
Glucose, Bld: 88 mg/dL (ref 65–99)
Potassium: 4.4 mmol/L (ref 3.5–5.3)
SODIUM: 139 mmol/L (ref 135–146)
TOTAL PROTEIN: 6.4 g/dL (ref 6.1–8.1)
Total Bilirubin: 0.5 mg/dL (ref 0.2–1.2)

## 2015-12-26 LAB — LIPID PANEL
Cholesterol: 157 mg/dL (ref 125–200)
HDL: 58 mg/dL (ref >=46)
LDL Cholesterol: 78 mg/dL (ref <130)
TRIGLYCERIDES: 103 mg/dL (ref <150)
Total CHOL/HDL Ratio: 2.7 Ratio (ref <=5.0)
VLDL: 21 mg/dL (ref <30)

## 2016-01-04 ENCOUNTER — Ambulatory Visit (INDEPENDENT_AMBULATORY_CARE_PROVIDER_SITE_OTHER): Payer: Medicare Other | Admitting: Internal Medicine

## 2016-01-04 ENCOUNTER — Encounter: Payer: Self-pay | Admitting: Internal Medicine

## 2016-01-04 VITALS — BP 130/90 | HR 64 | Ht 62.0 in | Wt 138.2 lb

## 2016-01-04 DIAGNOSIS — E785 Hyperlipidemia, unspecified: Secondary | ICD-10-CM

## 2016-01-04 DIAGNOSIS — E7800 Pure hypercholesterolemia, unspecified: Secondary | ICD-10-CM | POA: Diagnosis not present

## 2016-01-04 DIAGNOSIS — Z951 Presence of aortocoronary bypass graft: Secondary | ICD-10-CM

## 2016-01-04 DIAGNOSIS — I259 Chronic ischemic heart disease, unspecified: Secondary | ICD-10-CM | POA: Diagnosis not present

## 2016-01-04 NOTE — Patient Instructions (Signed)
Your physician wants you to follow-up in: 6 months with Dr. Hilty. You will receive a reminder letter in the mail two months in advance. If you don't receive a letter, please call our office to schedule the follow-up appointment.    

## 2016-01-04 NOTE — Progress Notes (Signed)
OFFICE NOTE  Chief Complaint:  Establish new cardiologist  Primary Care Physician: Mickie HillierLITTLE,Christine LORNE, MD  HPI:  [name] is a 81 y.o. female who was a former patient of Dr. Patty SermonsBrackbill. She is followed for a history of hypertension and dyslipidemia. She also has a history of coronary artery disease and is status post coronary artery bypass grafting in 2009 x 4 by Dr. Laneta SimmersBartle (LIMA to LAD, sequential SVG to the first diagonal and second and third obtuse marginal branches of the left circumflex, and SVG to the posterior descending branch of the right coronary). She is generally been asymptomatic since that time. She has not undergone any routine stress testing or echocardiography. Blood pressures been well controlled. Cholesterol is at goal on low-dose Zocor.  PMHx:  Past Medical History  Diagnosis Date  . Hypertension     ESSENTIAL  . Hypercholesterolemia   . Coronary artery disease   . Nausea   . Myalgia     Past Surgical History  Procedure Laterality Date  . Coronary artery bypass graft      FAMHx:  Family History  Problem Relation Age of Onset  . Stroke Mother   . Cancer Father     lung cancer  . Uterine cancer Sister   . Dementia Sister   . Dementia Sister   . Hypertension Sister     SOCHx:   reports that she has never smoked. She does not have any smokeless tobacco history on file. She reports that she does not drink alcohol or use illicit drugs.  ALLERGIES:  Allergies  Allergen Reactions  . Amiodarone     rash  . Crestor [Rosuvastatin Calcium]     Leg pain  . Ramipril     cough  . Simvastatin     Week hourse voice  . Zetia [Ezetimibe] Other (See Comments)    Muscle pain    ROS: Pertinent items noted in HPI and remainder of comprehensive ROS otherwise negative.  HOME MEDS: Current Outpatient Prescriptions  Medication Sig Dispense Refill  . aspirin 81 MG tablet Take 81 mg by mouth daily.      Marland Kitchen. losartan (COZAAR) 100 MG tablet Take 1 tablet  (100 mg total) by mouth daily. 90 tablet 2  . metoprolol tartrate (LOPRESSOR) 25 MG tablet Take 1 tablet (25 mg total) by mouth 2 (two) times daily. 180 tablet 2  . Multiple Vitamin (MULTIVITAMIN) tablet Take 1 tablet by mouth daily.      . simvastatin (ZOCOR) 10 MG tablet Take 1 tablet (10 mg total) by mouth at bedtime. 90 tablet 3   No current facility-administered medications for this visit.    LABS/IMAGING: No results found for this or any previous visit (from the past 48 hour(s)). No results found.  WEIGHTS: Wt Readings from Last 3 Encounters:  01/04/16 138 lb 3.2 oz (62.687 kg)  07/08/15 143 lb 3.2 oz (64.955 kg)  09/26/14 146 lb 6.4 oz (66.407 kg)    VITALS: BP 130/90 mmHg  Pulse 64  Ht 5\' 2"  (1.575 m)  Wt 138 lb 3.2 oz (62.687 kg)  BMI 25.27 kg/m2  EXAM: General appearance: alert and no distress Neck: no carotid bruit and no JVD Lungs: clear to auscultation bilaterally Heart: regular rate and rhythm Abdomen: soft, non-tender; bowel sounds normal; no masses,  no organomegaly Extremities: extremities normal, atraumatic, no cyanosis or edema Pulses: 2+ and symmetric Skin: Skin color, texture, turgor normal. No rashes or lesions Neurologic: Grossly normal Psych: Pleasant  EKG: Normal sinus rhythm at 64, nonspecific ST changes  ASSESSMENT: 1. CAD status post four-vessel CABG (2009) 2. Dyslipidemia 3. Hypertension  PLAN: 1.   [name]'s generally asymptomatic and now a years since bypass surgery. I advised her that we could consider stress testing even though she is asymptomatic however there is a small false positive rate. She wishes to consider this further but not pursue a stress testing at this time. Clinically she is on appropriate medications in her cholesterol appears to be well-controlled. Follow-up with me in 6 months or sooner as necessary.  [name], MD, [facility] Attending Cardiologist [facility]  [name] 01/04/2016, 6:06 PM

## 2016-01-06 ENCOUNTER — Encounter: Payer: Self-pay | Admitting: *Deleted

## 2016-06-17 ENCOUNTER — Other Ambulatory Visit: Payer: Self-pay | Admitting: Internal Medicine

## 2016-06-17 NOTE — Telephone Encounter (Signed)
Rx(s) sent to pharmacy electronically.  

## 2016-06-24 ENCOUNTER — Other Ambulatory Visit: Payer: Self-pay | Admitting: Internal Medicine

## 2016-09-27 ENCOUNTER — Other Ambulatory Visit: Payer: Self-pay | Admitting: Internal Medicine

## 2016-09-27 ENCOUNTER — Telehealth: Payer: Self-pay | Admitting: Internal Medicine

## 2016-09-27 DIAGNOSIS — E785 Hyperlipidemia, unspecified: Secondary | ICD-10-CM

## 2016-09-27 DIAGNOSIS — Z79899 Other long term (current) drug therapy: Secondary | ICD-10-CM | POA: Diagnosis not present

## 2016-09-27 NOTE — Telephone Encounter (Signed)
CMET & lipid ordered (per previous telephone encounter) Patient aware she will need to fast for labs.

## 2016-09-27 NOTE — Telephone Encounter (Signed)
REFILL 

## 2016-09-27 NOTE — Telephone Encounter (Signed)
Patient calling states that she would like to have blood work completed before her appointment on 09-30-16. Thanks.

## 2016-09-28 LAB — LIPID PANEL
CHOL/HDL RATIO: 2.8 ratio (ref <5.0)
CHOLESTEROL: 172 mg/dL (ref <200)
HDL: 62 mg/dL (ref >50)
LDL Cholesterol: 88 mg/dL (ref <100)
Triglycerides: 110 mg/dL (ref <150)
VLDL: 22 mg/dL (ref <30)

## 2016-09-28 LAB — COMPREHENSIVE METABOLIC PANEL
ALT: 11 U/L (ref 6–29)
AST: 16 U/L (ref 10–35)
Albumin: 3.8 g/dL (ref 3.6–5.1)
Alkaline Phosphatase: 85 U/L (ref 33–130)
BUN: 16 mg/dL (ref 7–25)
CO2: 25 mmol/L (ref 20–31)
CREATININE: 0.91 mg/dL (ref 0.60–0.93)
Calcium: 9.7 mg/dL (ref 8.6–10.4)
Chloride: 104 mmol/L (ref 98–110)
GLUCOSE: 86 mg/dL (ref 65–99)
Potassium: 4.7 mmol/L (ref 3.5–5.3)
SODIUM: 142 mmol/L (ref 135–146)
TOTAL PROTEIN: 6.7 g/dL (ref 6.1–8.1)
Total Bilirubin: 0.5 mg/dL (ref 0.2–1.2)

## 2016-09-30 ENCOUNTER — Encounter: Payer: Self-pay | Admitting: Internal Medicine

## 2016-09-30 ENCOUNTER — Ambulatory Visit (INDEPENDENT_AMBULATORY_CARE_PROVIDER_SITE_OTHER): Payer: Medicare HMO | Admitting: Internal Medicine

## 2016-09-30 VITALS — BP 132/70 | HR 50 | Ht 62.0 in | Wt 132.0 lb

## 2016-09-30 DIAGNOSIS — I10 Essential (primary) hypertension: Secondary | ICD-10-CM | POA: Diagnosis not present

## 2016-09-30 DIAGNOSIS — Z951 Presence of aortocoronary bypass graft: Secondary | ICD-10-CM

## 2016-09-30 DIAGNOSIS — E785 Hyperlipidemia, unspecified: Secondary | ICD-10-CM

## 2016-09-30 DIAGNOSIS — I2510 Atherosclerotic heart disease of native coronary artery without angina pectoris: Secondary | ICD-10-CM

## 2016-09-30 DIAGNOSIS — I2583 Coronary atherosclerosis due to lipid rich plaque: Secondary | ICD-10-CM

## 2016-09-30 MED ORDER — SIMVASTATIN 20 MG PO TABS: ORAL_TABLET | ORAL | 3 refills | Status: DC

## 2016-09-30 MED ORDER — SIMVASTATIN 10 MG PO TABS: ORAL_TABLET | ORAL | 3 refills | Status: DC

## 2016-09-30 NOTE — Addendum Note (Signed)
Addended by: Lindell Spar on: 09/30/2016 04:48 PM   Modules accepted: Orders

## 2016-09-30 NOTE — Patient Instructions (Signed)
Your physician has recommended you make the following change in your medication -- simvastatin  alternating with  every other day  Your physician recommends that you return for lab work in: THREE MONTHS  (fasting - to recheck cholesterol)  Your physician wants you to follow-up in: ONE YEAR with Dr. Rennis Golden. You will receive a reminder letter in the mail two months in advance. If you don't receive a letter, please call our office to schedule the follow-up appointment.

## 2016-09-30 NOTE — Progress Notes (Signed)
OFFICE NOTE  Chief Complaint:  No complaints  Primary Care Physician: Mickie Hillier, MD  HPI:  [name] is a 82 y.o. female who was a former patient of Dr. Patty Sermons. She is followed for a history of hypertension and dyslipidemia. She also has a history of coronary artery disease and is status post coronary artery bypass grafting in 2009 x 4 by Dr. Laneta Simmers (LIMA to LAD, sequential SVG to the first diagonal and second and third obtuse marginal branches of the left circumflex, and SVG to the posterior descending branch of the right coronary). She is generally been asymptomatic since that time. She has not undergone any routine stress testing or echocardiography. Blood pressures been well controlled. Cholesterol is at goal on low-dose Zocor.  09/30/2016  [name] was seen today in follow-up. She seems to be doing well without any new complaints. Blood pressure was excellent today 132/70. She says that she's had intolerance of statins in the past. She reports myalgias with Lipitor and to a lesser extent Crestor and she developed some hoarseness in her voice with simvastatin. She has been able to tolerate 10 mg a simvastatin. We recently repeated a lipid profile showed total cholesterol 172, HDL 62, LDL 88 and triglycerides 110. I initially advised her to increase for simvastatin however she is little hesitant to do that. We had a nice discussion about that in the office today. Apparently she's been on Zetia in the past with even worse symptoms of muscle pain than with her Lipitor. She's also previously tried fish oil and niacin. We discussed the possibility of asymptomatic stress testing because of her bypass grafts being quite old at this point however she declined at this time.  PMHx:  Past Medical History:  Diagnosis Date  . Cataract   . Coronary artery disease   . Hypercholesterolemia   . Hypertension    ESSENTIAL  . Myalgia   . Nausea     Past Surgical History:    Procedure Laterality Date  . CORONARY ARTERY BYPASS GRAFT      FAMHx:  Family History  Problem Relation Age of Onset  . Stroke Mother 78  . Cancer Father     lung cancer  . Uterine cancer Sister 78  . Dementia Sister   . Dementia Sister   . Hypertension Sister   . Stroke Sister 42  . Heart disease Sister 54    SOCHx:   reports that she has never smoked. She has never used smokeless tobacco. She reports that she does not drink alcohol or use drugs.  ALLERGIES:  Allergies  Allergen Reactions  . Amiodarone     rash  . Crestor [Rosuvastatin Calcium]     Leg pain  . Ramipril     cough  . Simvastatin     Week hourse voice  . Zetia [Ezetimibe] Other (See Comments)    Muscle pain    ROS: Pertinent items noted in HPI and remainder of comprehensive ROS otherwise negative.  HOME MEDS: Current Outpatient Prescriptions  Medication Sig Dispense Refill  . aspirin 81 MG tablet Take 81 mg by mouth daily.      Marland Kitchen losartan (COZAAR) 100 MG tablet Take 1 tablet (100 mg total) by mouth daily. KEEP OV. 90 tablet 0  . metoprolol tartrate (LOPRESSOR) 25 MG tablet Take 1 tablet (25 mg total) by mouth 2 (two) times daily. KEEP OV. 180 tablet 0  . Multiple Vitamin (MULTIVITAMIN) tablet Take 1 tablet by mouth daily.      Marland Kitchen  simvastatin (ZOCOR) 10 MG tablet Take 1 tablet ( ) by mouth alternating with 2 tablets ( ) every other day. 135 tablet 3   No current facility-administered medications for this visit.     LABS/IMAGING: No results found for this or any previous visit (from the past 48 hour(s)). No results found.  WEIGHTS: Wt Readings from Last 3 Encounters:  09/30/16 132 lb (59.9 kg)  01/04/16 138 lb 3.2 oz (62.7 kg)  07/08/15 143 lb 3.2 oz (65 kg)    VITALS: BP 132/70 (BP Location: Left Arm, Patient Position: Sitting, Cuff Size: Normal)   Pulse (!) 50   Ht  (1.575 m)   Wt 132 lb (59.9 kg)   BMI 24.14 kg/m   EXAM: General appearance: alert and no distress Neck:  no carotid bruit and no JVD Lungs: clear to auscultation bilaterally Heart: regular rate and rhythm Abdomen: soft, non-tender; bowel sounds normal; no masses,  no organomegaly Extremities: extremities normal, atraumatic, no cyanosis or edema Pulses: 2+ and symmetric Skin: Skin color, texture, turgor normal. No rashes or lesions Neurologic: Grossly normal Psych: Pleasant  EKG: Sinus bradycardia 50, nonspecific ST-T wave changes  ASSESSMENT: 1. CAD status post four-vessel CABG (2009) 2. Dyslipidemia 3. Hypertension  PLAN: 1.   [name] seems to be doing well without any new complaints. She had four-vessel CABG in 2009 in has not had any further chest pain. She seems to be fairly physically active for her age and denies any worsening fatigue or shortness of breath. Cholesterol is slightly higher than goal. We discussed at length about how we might achieve a lower cholesterol. She seems willing to try a compromise of alternating 10 mg with 20 mg of simvastatin every other day. Will repeat a lipid profile in 3 months to see if she is at goal. If for some reason this causes her worsening hoarseness of her voice, then we will consider trying low-dose Crestor 5 or 10 mg every other day.  Follow-up annually or sooner as necessary.  [name], MD, [facility] Attending Cardiologist [facility]  [name] 09/30/2016, 3:30 PM

## 2016-10-03 ENCOUNTER — Other Ambulatory Visit: Payer: Self-pay | Admitting: Internal Medicine

## 2016-10-03 MED ORDER — SIMVASTATIN 20 MG PO TABS: ORAL_TABLET | ORAL | 11 refills | Status: DC

## 2016-10-03 NOTE — Telephone Encounter (Signed)
Patient's alternating simvastatin dose is not covered (quantity) Sent in Rx for #30 tabs for a 1 month supply   Disp Refills Start End   simvastatin (ZOCOR) 20 MG tablet 30 tablet 11 10/03/2016    Sig: Take 1 tablet ( ) by mouth alternating with 1/2 tablet ( ) every other day   E-Prescribing Status: Receipt confirmed by pharmacy (10/03/2016 10:15 AM EDT)   Pharmacy   [id number] - [place], [place] - [address]

## 2016-12-28 ENCOUNTER — Other Ambulatory Visit: Payer: Self-pay | Admitting: Internal Medicine

## 2017-01-21 ENCOUNTER — Other Ambulatory Visit: Payer: Self-pay | Admitting: Internal Medicine

## 2017-01-23 NOTE — Telephone Encounter (Signed)
REFILL 

## 2017-02-10 ENCOUNTER — Other Ambulatory Visit: Payer: Self-pay | Admitting: Obstetrics and Gynecology

## 2017-02-10 DIAGNOSIS — Z1231 Encounter for screening mammogram for malignant neoplasm of breast: Secondary | ICD-10-CM

## 2017-02-21 ENCOUNTER — Ambulatory Visit
Admission: RE | Admit: 2017-02-21 | Discharge: 2017-02-21 | Disposition: A | Discharge status: Home / Self Care | Payer: Medicare HMO | Source: Ambulatory Visit | Attending: Obstetrics and Gynecology | Admitting: Obstetrics and Gynecology

## 2017-02-21 DIAGNOSIS — Z1231 Encounter for screening mammogram for malignant neoplasm of breast: Secondary | ICD-10-CM | POA: Diagnosis not present

## 2017-03-26 ENCOUNTER — Other Ambulatory Visit: Payer: Self-pay | Admitting: Internal Medicine

## 2017-04-18 DIAGNOSIS — Z01419 Encounter for gynecological examination (general) (routine) without abnormal findings: Secondary | ICD-10-CM | POA: Diagnosis not present

## 2017-04-18 DIAGNOSIS — Z6825 Body mass index (BMI) 25.0-25.9, adult: Secondary | ICD-10-CM | POA: Diagnosis not present

## 2017-06-23 ENCOUNTER — Other Ambulatory Visit: Payer: Self-pay | Admitting: Internal Medicine

## 2017-09-29 ENCOUNTER — Other Ambulatory Visit: Payer: Self-pay | Admitting: Internal Medicine

## 2017-10-04 ENCOUNTER — Other Ambulatory Visit: Payer: Self-pay | Admitting: Internal Medicine

## 2017-10-04 MED ORDER — METOPROLOL TARTRATE 25 MG PO TABS: 25.0000 mg | ORAL_TABLET | Freq: Two times a day (BID) | ORAL | 1 refills | Status: DC

## 2017-10-04 NOTE — Telephone Encounter (Signed)
°*  STAT* If patient is at the pharmacy, call can be transferred to refill team.   1. Which medications need to be refilled? (please list name of each medication and dose if known) Metoprolol-pt made an appointment  2. Which pharmacy/location (including street and city if local pharmacy) is medication to be sent to?[facility] [phone]  3. Do they need a 30 day or 90 day supply? 90 and refills

## 2017-10-05 ENCOUNTER — Other Ambulatory Visit: Payer: Self-pay

## 2017-11-03 ENCOUNTER — Telehealth: Payer: Self-pay | Admitting: Internal Medicine

## 2017-11-03 ENCOUNTER — Other Ambulatory Visit: Payer: Self-pay

## 2017-11-03 DIAGNOSIS — E785 Hyperlipidemia, unspecified: Secondary | ICD-10-CM

## 2017-11-03 DIAGNOSIS — I2583 Coronary atherosclerosis due to lipid rich plaque: Principal | ICD-10-CM

## 2017-11-03 DIAGNOSIS — I2510 Atherosclerotic heart disease of native coronary artery without angina pectoris: Secondary | ICD-10-CM

## 2017-11-03 NOTE — Telephone Encounter (Signed)
Spoke to patient advised she can have lipid panel 5/20 or 5/21 here a [facility].Order placed in lab box.

## 2017-11-03 NOTE — Telephone Encounter (Signed)
New Message  Pt states she is suppose to have labs prior to her appt but no orders in epic. Please call

## 2017-11-07 DIAGNOSIS — I2583 Coronary atherosclerosis due to lipid rich plaque: Secondary | ICD-10-CM | POA: Diagnosis not present

## 2017-11-07 DIAGNOSIS — E785 Hyperlipidemia, unspecified: Secondary | ICD-10-CM | POA: Diagnosis not present

## 2017-11-07 DIAGNOSIS — I2510 Atherosclerotic heart disease of native coronary artery without angina pectoris: Secondary | ICD-10-CM | POA: Diagnosis not present

## 2017-11-07 LAB — LIPID PANEL W/O CHOL/HDL RATIO
Cholesterol, Total: 231 mg/dL — ABNORMAL HIGH (ref 100–199)
HDL: 57 mg/dL (ref >39)
LDL CALC: 148 mg/dL — AB (ref 0–99)
TRIGLYCERIDES: 131 mg/dL (ref 0–149)
VLDL Cholesterol Cal: 26 mg/dL (ref 5–40)

## 2017-11-10 ENCOUNTER — Encounter: Payer: Self-pay | Admitting: Internal Medicine

## 2017-11-10 ENCOUNTER — Ambulatory Visit: Payer: Medicare HMO | Admitting: Internal Medicine

## 2017-11-10 ENCOUNTER — Telehealth (HOSPITAL_COMMUNITY): Payer: Self-pay

## 2017-11-10 VITALS — BP 150/80 | HR 52 | Ht 62.0 in | Wt 133.0 lb

## 2017-11-10 DIAGNOSIS — I2510 Atherosclerotic heart disease of native coronary artery without angina pectoris: Secondary | ICD-10-CM | POA: Diagnosis not present

## 2017-11-10 DIAGNOSIS — Z951 Presence of aortocoronary bypass graft: Secondary | ICD-10-CM

## 2017-11-10 DIAGNOSIS — Z789 Other specified health status: Secondary | ICD-10-CM | POA: Insufficient documentation

## 2017-11-10 DIAGNOSIS — I10 Essential (primary) hypertension: Secondary | ICD-10-CM

## 2017-11-10 DIAGNOSIS — E785 Hyperlipidemia, unspecified: Secondary | ICD-10-CM

## 2017-11-10 MED ORDER — HYDROCHLOROTHIAZIDE 25 MG PO TABS: 25.0000 mg | ORAL_TABLET | Freq: Every day | ORAL | 2 refills | Status: DC

## 2017-11-10 NOTE — Telephone Encounter (Signed)
Encounter complete. 

## 2017-11-10 NOTE — Progress Notes (Signed)
OFFICE NOTE  Chief Complaint:  Routine follow-up  Primary Care Physician: Catha Gosselin, MD  HPI:  [name] is a 83 y.o. female who was a former patient of Dr. Patty Sermons. She is followed for a history of hypertension and dyslipidemia. She also has a history of coronary artery disease and is status post coronary artery bypass grafting in 2009 x 4 by Dr. Laneta Simmers (LIMA to LAD, sequential SVG to the first diagonal and second and third obtuse marginal branches of the left circumflex, and SVG to the posterior descending branch of the right coronary). She is generally been asymptomatic since that time. She has not undergone any routine stress testing or echocardiography. Blood pressures been well controlled. Cholesterol is at goal on low-dose Zocor.  09/30/2016  [name] was seen today in follow-up. She seems to be doing well without any new complaints. Blood pressure was excellent today 132/70. She says that she's had intolerance of statins in the past. She reports myalgias with Lipitor and to a lesser extent Crestor and she developed some hoarseness in her voice with simvastatin. She has been able to tolerate 10 mg a simvastatin. We recently repeated a lipid profile showed total cholesterol 172, HDL 62, LDL 88 and triglycerides 110. I initially advised her to increase for simvastatin however she is little hesitant to do that. We had a nice discussion about that in the office today. Apparently she's been on Zetia in the past with even worse symptoms of muscle pain than with her Lipitor. She's also previously tried fish oil and niacin. We discussed the possibility of asymptomatic stress testing because of her bypass grafts being quite old at this point however she declined at this time.  11/10/2017  [name] was seen today in follow-up.  She is done well over the past year.  She is now 10 years out from coronary artery bypass grafting in 2009.  She denies chest pain or worsening shortness of  breath.  She has not had interval assessment of her bypass grafts with stress testing.  She had a well-controlled lipid profile on simvastatin, however discontinued this due to side effects including vocal hoarseness.  She was switched to rosuvastatin which time she developed leg pain both on the 5 and 10 mg doses.  She is previously been on ezetimibe and developed muscle pain.  Based on this she is deemed statin intolerant.  She does have ASCVD with four-vessel bypass in 2009 and has a goal LDL less than 70.  She is not likely to achieve that goal without additional therapy.  Unfortunately, she just had a repeat lipid profile which showed total cholesterol 231, triglycerides 131, HDL 57 and LDL 148 (off of therapy).  Based on this, I think she is a good candidate for PCSK9 inhibitor therapy.  We discussed this today and will pursue Repatha.  PMHx:  Past Medical History:  Diagnosis Date  . Cataract   . Coronary artery disease   . Hypercholesterolemia   . Hypertension    ESSENTIAL  . Myalgia   . Nausea     Past Surgical History:  Procedure Laterality Date  . CORONARY ARTERY BYPASS GRAFT      FAMHx:  Family History  Problem Relation Age of Onset  . Stroke Mother 19  . Cancer Father        lung cancer  . Uterine cancer Sister 10  . Dementia Sister   . Dementia Sister   . Hypertension Sister   .  Stroke Sister 39  . Heart disease Sister 36    SOCHx:   reports that she has never smoked. She has never used smokeless tobacco. She reports that she does not drink alcohol or use drugs.  ALLERGIES:  Allergies  Allergen Reactions  . Amiodarone     rash  . Crestor [Rosuvastatin Calcium]     Leg pain  . Ramipril     cough  . Simvastatin     Week hourse voice  . Zetia [Ezetimibe] Other (See Comments)    Muscle pain    ROS: Pertinent items noted in HPI and remainder of comprehensive ROS otherwise negative.  HOME MEDS: Current Outpatient Medications  Medication Sig Dispense  Refill  . aspirin 81 MG tablet Take 81 mg by mouth daily.      Marland Kitchen losartan (COZAAR) 100 MG tablet TAKE 1 TABLET BY MOUTH ONCE DAILY 90 tablet 3  . metoprolol tartrate (LOPRESSOR) 25 MG tablet Take 1 tablet (25 mg total) by mouth 2 (two) times daily. MUST KEEP SCHEDULED APPT FOR FUTURE REFILLS 60 tablet 1  . Multiple Vitamin (MULTIVITAMIN) tablet Take 1 tablet by mouth daily.      . simvastatin (ZOCOR) 20 MG tablet Take 1 tablet ( ) by mouth alternating with 1/2 tablet ( ) every other day 30 tablet 11   No current facility-administered medications for this visit.     LABS/IMAGING: No results found for this or any previous visit (from the past 48 hour(s)). No results found.  WEIGHTS: Wt Readings from Last 3 Encounters:  09/30/16 132 lb (59.9 kg)  01/04/16 138 lb 3.2 oz (62.7 kg)  07/08/15 143 lb 3.2 oz (65 kg)    VITALS: There were no vitals taken for this visit.  EXAM: General appearance: alert and no distress Neck: no carotid bruit and no JVD Lungs: clear to auscultation bilaterally Heart: regular rate and rhythm Abdomen: soft, non-tender; bowel sounds normal; no masses,  no organomegaly Extremities: extremities normal, atraumatic, no cyanosis or edema Pulses: 2+ and symmetric Skin: Skin color, texture, turgor normal. No rashes or lesions Neurologic: Grossly normal Psych: Pleasant  EKG: Sinus bradycardia with sinus arrhythmia at 52, LVH by voltage-personally reviewed  ASSESSMENT: 1. CAD status post four-vessel CABG (2009) 2. Dyslipidemia 3. Hypertension 4. Statin intolerant  PLAN: 1.   [name] is doing well from a cardiac standpoint.  She has not had assessment of her bypass graft since 2009.  We will order a Lexiscan Myoview today as per appropriate use criteria for screening in asymptomatic patients more than 5 years out from bypass.  She does have marked dyslipidemia with a marked increase in LDL cholesterol up to 148 off of her simvastatin.  She has been  statin intolerant and is a good candidate for PCSK9 inhibitor given her history of ASCVD and prior CABG.  I am recommending starting Repatha sure click injector.  I demonstrated the use that for her in the office today.  We will start preauthorization paperwork.  Finally, her blood pressure remains elevated.  She says is generally around 140-150 systolic over 90 at home.  I will add HCTZ 25 mg daily to her regimen.  She is on losartan however recently losartan/HCTZ has been on recall.  Once this is more widely available, we could consider combining the medications.  Recheck a basic metabolic profile in about a week.  Follow-up with me in about 3 months.  [name], MD, [facility], FACP  [place]  [facility]  Medical Director  of the [facility] Lipid [facility]  Cardiovascular Risk Reduction [facility] Diplomate of the American Board of Clinical Lipidology Attending Cardiologist  Direct Dial: [phone]  Fax: [phone]  Website:  [email or web address] Nicely Ilijah [email or web address] 11/10/2017, 9:30 AM

## 2017-11-10 NOTE — Patient Instructions (Signed)
Medication Instructions: START the Hydrochlorothiazide 25 mg daily Dr.Hilty's nurse will be in touch with you about starting the Repatha.    If you need a refill on your cardiac medications before your next appointment, please call your pharmacy.   Lab:  Your provider would like for you to return in one week to have the following labs drawn: BMET. You do not need an appointment for the lab. Once in our office lobby there is a podium where you can sign in and ring the doorbell to alert us that you are here. The lab is open from 8:00 am to[place] 4:30 pm; closed for lunch from 12:45pm-1:45pm.    Procedures/Testing: Your physician has requested that you have a lexiscan myoview. For further information please visit [email or web address] Please follow instruction sheet, as given. This will take place at [address] [facility], [address] You do not need to hold any medications for this test.  Follow-Up: Your physician wants you to follow-up in 3 months with Dr. Rennis Golden.    Thank you for choosing [facility] at [facility]!!

## 2017-11-15 ENCOUNTER — Ambulatory Visit (HOSPITAL_COMMUNITY)
Admission: RE | Admit: 2017-11-15 | Discharge: 2017-11-15 | Disposition: A | Discharge status: Home / Self Care | Payer: Medicare HMO | Source: Ambulatory Visit | Attending: Cardiology | Admitting: Cardiology

## 2017-11-15 ENCOUNTER — Telehealth: Payer: Self-pay | Admitting: Internal Medicine

## 2017-11-15 DIAGNOSIS — I2510 Atherosclerotic heart disease of native coronary artery without angina pectoris: Secondary | ICD-10-CM | POA: Diagnosis not present

## 2017-11-15 DIAGNOSIS — I10 Essential (primary) hypertension: Secondary | ICD-10-CM | POA: Diagnosis not present

## 2017-11-15 DIAGNOSIS — Z8249 Family history of ischemic heart disease and other diseases of the circulatory system: Secondary | ICD-10-CM | POA: Diagnosis not present

## 2017-11-15 DIAGNOSIS — Z951 Presence of aortocoronary bypass graft: Secondary | ICD-10-CM | POA: Diagnosis not present

## 2017-11-15 LAB — MYOCARDIAL PERFUSION IMAGING
CHL CUP NUCLEAR SRS: 3
CHL CUP RESTING HR STRESS: 45 {beats}/min
CSEPPHR: 93 {beats}/min
LV dias vol: 55 mL (ref 46–106)
LV sys vol: 20 mL
SDS: 5
SSS: 8
TID: 1.08

## 2017-11-15 MED ORDER — REGADENOSON 0.4 MG/5ML IV SOLN
0.4000 mg | Freq: Once | INTRAVENOUS | Status: AC
  Administered 2017-11-15: 0.4 mg via INTRAVENOUS

## 2017-11-15 MED ORDER — TECHNETIUM TC 99M TETROFOSMIN IV KIT
32.3000 | PACK | Freq: Once | INTRAVENOUS | Status: AC | PRN
  Administered 2017-11-15: 32.3 via INTRAVENOUS
  Filled 2017-11-15: qty 33

## 2017-11-15 MED ORDER — TECHNETIUM TC 99M TETROFOSMIN IV KIT
9.7000 | PACK | Freq: Once | INTRAVENOUS | Status: AC | PRN
  Administered 2017-11-15: 9.7 via INTRAVENOUS
  Filled 2017-11-15: qty 10

## 2017-11-15 NOTE — Telephone Encounter (Signed)
Repatha [employer] approved per fax from [employer] from 06/18/2018 - 06/19/2018.

## 2017-11-15 NOTE — Telephone Encounter (Signed)
Prior authorization for Repatha [employer] submitted via [email or web address] ([employer])

## 2017-11-15 NOTE — Telephone Encounter (Signed)
Patient notified that her PCSK9 has been approved. She will contact her insurance to determine which pharmacy this medication can be filled/dispensed from and call me back. After this, can determine if she would like to pursue patient assistance.

## 2017-11-17 ENCOUNTER — Telehealth: Payer: Self-pay | Admitting: Internal Medicine

## 2017-11-17 NOTE — Telephone Encounter (Signed)
Pt calling   Stating Eileen Stanford told her to call back today with information concerning Repatha. Please call pt.

## 2017-11-17 NOTE — Telephone Encounter (Signed)
Patient returned call. She states she has a $95 deductible and she has a $100 co-pay/coinsurance per her insurance company. She will meet the household income qualifications for [employer]. Mailed patient the application and advised her to complete and get back to our office as soon as possible for submission.

## 2017-11-24 ENCOUNTER — Telehealth: Payer: Self-pay | Admitting: Internal Medicine

## 2017-11-24 DIAGNOSIS — I10 Essential (primary) hypertension: Secondary | ICD-10-CM | POA: Diagnosis not present

## 2017-11-24 DIAGNOSIS — E785 Hyperlipidemia, unspecified: Secondary | ICD-10-CM

## 2017-11-24 DIAGNOSIS — Z951 Presence of aortocoronary bypass graft: Secondary | ICD-10-CM | POA: Diagnosis not present

## 2017-11-24 LAB — BASIC METABOLIC PANEL
BUN / CREAT RATIO: 26 (ref 12–28)
BUN: 28 mg/dL — AB (ref 8–27)
CALCIUM: 9.6 mg/dL (ref 8.7–10.3)
CHLORIDE: 97 mmol/L (ref 96–106)
CO2: 25 mmol/L (ref 20–29)
CREATININE: 1.06 mg/dL — AB (ref 0.57–1.00)
GFR calc non Af Amer: 50 mL/min/{1.73_m2} — ABNORMAL LOW (ref >59)
GFR, EST AFRICAN AMERICAN: 57 mL/min/{1.73_m2} — AB (ref >59)
Glucose: 110 mg/dL — ABNORMAL HIGH (ref 65–99)
Potassium: 3.8 mmol/L (ref 3.5–5.2)
Sodium: 136 mmol/L (ref 134–144)

## 2017-11-24 NOTE — Telephone Encounter (Signed)
Spoke with patient who states she received mailed patient assistance application for Repatha and she plans to bring this on 11/27/17. Advised I will be able to send this in once received.

## 2017-11-27 NOTE — Telephone Encounter (Signed)
Received completed [employer] Repatha patient assistance application. Will have MD sing on 6/18 when he is in office and fax this day.

## 2017-11-30 ENCOUNTER — Other Ambulatory Visit: Payer: Self-pay | Admitting: Internal Medicine

## 2017-12-01 NOTE — Telephone Encounter (Signed)
Rx request sent to pharmacy.  

## 2017-12-05 NOTE — Telephone Encounter (Signed)
Faxed Repatha patient assistance to [employer] @ [phone] 

## 2017-12-12 NOTE — Telephone Encounter (Signed)
Received fax notification from [employer] that patient has been approved for Repatha patient assistance from 11/29/17 - 06/19/18.   LMTCB to discuss with patient. Will need her to contact company and then notify our office once she starts medication. She will need lipis 3 months after starting med & f/up will need to be moved to later date based on this.

## 2017-12-19 NOTE — Telephone Encounter (Signed)
Spoke with patient who received Repatha from [employer] and took first dose on 12/15/17 with no issues. She asked about disposing needles and [facility] sharps container from local pharmacy.   Patient has been r/s to see MD in Sept (vs. August) since she started Repatha late June and was advised to have labs done the week prior.

## 2017-12-20 NOTE — Addendum Note (Signed)
Addended by: Lindell SparELKINS, JENNA M on: 12/20/2017 10:57 AM   Modules accepted: Orders

## 2018-01-25 ENCOUNTER — Other Ambulatory Visit: Payer: Self-pay | Admitting: Internal Medicine

## 2018-01-25 NOTE — Telephone Encounter (Signed)
Rx sent to pharmacy   

## 2018-02-02 ENCOUNTER — Telehealth: Payer: Self-pay | Admitting: Internal Medicine

## 2018-02-02 NOTE — Telephone Encounter (Signed)
Thanks .. I agree.  Dr. H 

## 2018-02-02 NOTE — Telephone Encounter (Signed)
New message   Pt c/o BP issue: STAT if pt c/o blurred vision, one-sided weakness or slurred speech  1. What are your last 5 BP readings? 102/61 HR 103  2. Are you having any other symptoms (ex. Dizziness, headache, blurred vision, passed out)? Dizziness  3. What is your BP issue? Feels BP is too low

## 2018-02-02 NOTE — Telephone Encounter (Signed)
Spoke with pt. Pt sts that her BP this morning was 102/61  HR 103. Pt sts that this was 2 hours after taking all her medications.  Pt sts that yesterday she was feeling dizzy and week so she checked her BP and it was 114/70. So she held her HCTZ. Through the day she began to feel worse so she resumed all her meds this morning.  Pt sts that she mostly drinks diest cokes and no water. Recommended that she liberalized her fluids today. I asked her to recheck her BP while I waited on the phone and her BP was 120/65 bpm. Pt sts that after breakfast she began to feel a little better.  Ad pt that Dr.Hilty is out of the office. I will discuss her symptoms with our office DOD Dr.Christopher and call back with her recommendation.   Pt aware of Dr.Christopher's recommendation. HOLD HCTZ over the weekend. Pt should record her BP daily 2 hours after taking her medication. Liberalized fluid. Pt should call our office Mon to report her BP and to be given further instruction.  Pt also sts that she has been having an issue starting a stream when voiding. Pt denies painful urination. Adv pt f/u with her pcp regarding her voiding issue.  Adv pt that I will fwd an update to Dr.Hilty so that he is updated.   Pt agreeable with plan and verbalized

## 2018-02-09 ENCOUNTER — Ambulatory Visit: Payer: Medicare HMO | Admitting: Internal Medicine

## 2018-02-13 ENCOUNTER — Other Ambulatory Visit: Payer: Self-pay | Admitting: Obstetrics and Gynecology

## 2018-02-13 DIAGNOSIS — Z1231 Encounter for screening mammogram for malignant neoplasm of breast: Secondary | ICD-10-CM

## 2018-02-28 DIAGNOSIS — E785 Hyperlipidemia, unspecified: Secondary | ICD-10-CM | POA: Diagnosis not present

## 2018-02-28 LAB — LIPID PANEL
CHOLESTEROL TOTAL: 149 mg/dL (ref 100–199)
Chol/HDL Ratio: 2.8 ratio (ref 0.0–4.4)
HDL: 54 mg/dL (ref >39)
LDL Calculated: 72 mg/dL (ref 0–99)
TRIGLYCERIDES: 114 mg/dL (ref 0–149)
VLDL Cholesterol Cal: 23 mg/dL (ref 5–40)

## 2018-03-06 ENCOUNTER — Ambulatory Visit: Payer: Medicare HMO | Admitting: Internal Medicine

## 2018-03-06 ENCOUNTER — Telehealth: Payer: Self-pay | Admitting: Internal Medicine

## 2018-03-06 ENCOUNTER — Encounter: Payer: Self-pay | Admitting: Internal Medicine

## 2018-03-06 VITALS — BP 142/78 | HR 75 | Ht 61.0 in | Wt 131.6 lb

## 2018-03-06 DIAGNOSIS — Z789 Other specified health status: Secondary | ICD-10-CM | POA: Diagnosis not present

## 2018-03-06 DIAGNOSIS — E785 Hyperlipidemia, unspecified: Secondary | ICD-10-CM

## 2018-03-06 DIAGNOSIS — Z951 Presence of aortocoronary bypass graft: Secondary | ICD-10-CM | POA: Diagnosis not present

## 2018-03-06 DIAGNOSIS — I2583 Coronary atherosclerosis due to lipid rich plaque: Secondary | ICD-10-CM | POA: Diagnosis not present

## 2018-03-06 DIAGNOSIS — I2510 Atherosclerotic heart disease of native coronary artery without angina pectoris: Secondary | ICD-10-CM | POA: Diagnosis not present

## 2018-03-06 MED ORDER — METOPROLOL TARTRATE 25 MG PO TABS: 25.0000 mg | ORAL_TABLET | Freq: Two times a day (BID) | ORAL | 3 refills | Status: DC

## 2018-03-06 MED ORDER — HYDROCHLOROTHIAZIDE 12.5 MG PO CAPS: 12.5000 mg | ORAL_CAPSULE | Freq: Every day | ORAL | 0 refills | Status: DC

## 2018-03-06 NOTE — Telephone Encounter (Signed)
Added to med list

## 2018-03-06 NOTE — Patient Instructions (Addendum)
Your physician has recommended you make the following change in your medication:  -- START hydrochlorothiazide (hctz) 12.5mg  daily  -- if you tolerate, please call our office & we can combine this with losartan for ONE PILL -- continue all other medications  Please complete Repatha patient assistance application and return to Julaine FusiJenna Temeca Somma RN in December   Your physician wants you to follow-up in: 6 months with Dr. Rennis GoldenHilty (with fasting lab work to check cholesterol prior). You will receive a reminder letter in the mail two months in advance. If you don't receive a letter, please call our office to schedule the follow-up appointment.

## 2018-03-06 NOTE — Telephone Encounter (Signed)
New message:     Pt states she was told to call back and give her dosage for her repatha. Pt states it is 140 and she states she takes it 2 times a month.

## 2018-03-06 NOTE — Progress Notes (Signed)
OFFICE NOTE  Chief Complaint:  Routine follow-up  Primary Care Physician: Christine GosselinLittle, Kevin, MD  HPI:  [name] is a 83 y.o. female who was a former patient of [name]. She is followed for a history of hypertension and dyslipidemia. She also has a history of coronary artery disease and is status post coronary artery bypass grafting in 2009 x 4 by [name] (LIMA to LAD, sequential SVG to the first diagonal and second and third obtuse marginal branches of the left circumflex, and SVG to the posterior descending branch of the right coronary). She is generally been asymptomatic since that time. She has not undergone any routine stress testing or echocardiography. Blood pressures been well controlled. Cholesterol is at goal on low-dose Zocor.  09/30/2016  [name] was seen today in follow-up. She seems to be doing well without any new complaints. Blood pressure was excellent today 132/70. She says that she's had intolerance of statins in the past. She reports myalgias with Lipitor and to a lesser extent Crestor and she developed some hoarseness in her voice with simvastatin. She has been able to tolerate 10 mg a simvastatin. We recently repeated a lipid profile showed total cholesterol 172, HDL 62, LDL 88 and triglycerides 110. I initially advised her to increase for simvastatin however she is little hesitant to do that. We had a nice discussion about that in the office today. Apparently she's been on Zetia in the past with even worse symptoms of muscle pain than with her Lipitor. She's also previously tried fish oil and niacin. We discussed the possibility of asymptomatic stress testing because of her bypass grafts being quite old at this point however she declined at this time.  11/10/2017  [name] was seen today in follow-up.  She is done well over the past year.  She is now 10 years out from coronary artery bypass grafting in 2009.  She denies chest pain or worsening shortness of  breath.  She has not had interval assessment of her bypass grafts with stress testing.  She had a well-controlled lipid profile on simvastatin, however discontinued this due to side effects including vocal hoarseness.  She was switched to rosuvastatin which time she developed leg pain both on the 5 and 10 mg doses.  She is previously been on ezetimibe and developed muscle pain.  Based on this she is deemed statin intolerant.  She does have ASCVD with four-vessel bypass in 2009 and has a goal LDL less than 70.  She is not likely to achieve that goal without additional therapy.  Unfortunately, she just had a repeat lipid profile which showed total cholesterol 231, triglycerides 131, HDL 57 and LDL 148 (off of therapy).  Based on this, I think she is a good candidate for PCSK9 inhibitor therapy.  We discussed this today and will pursue Repatha.  03/06/2018  [name] is seen today in follow-up.  She was felt to be a good candidate for PCSK9 and therapy and has started on Repatha.  I am pleased to report marked improvement in her dyslipidemia.  Her total cholesterol is decreased from 2 31-1 49.  Triglycerides have decreased from 131-1 14, HDL is increased from 54-57 and LDL cholesterol has decreased from 148-72.  She is tolerating the medication without any significant side effects.  This puts her slightly above goal LDL less than 70.  PMHx:  Past Medical History:  Diagnosis Date  . Cataract   . Coronary artery disease   .  Hypercholesterolemia   . Hypertension    ESSENTIAL  . Myalgia   . Nausea     Past Surgical History:  Procedure Laterality Date  . CORONARY ARTERY BYPASS GRAFT      FAMHx:  Family History  Problem Relation Age of Onset  . Stroke Mother 41  . Cancer Father        lung cancer  . Uterine cancer Sister 63  . Dementia Sister   . Dementia Sister   . Hypertension Sister   . Stroke Sister 15  . Heart disease Sister 41    SOCHx:   reports that she has never smoked. She has  never used smokeless tobacco. She reports that she does not drink alcohol or use drugs.  ALLERGIES:  Allergies  Allergen Reactions  . Amiodarone     rash  . Crestor [Rosuvastatin Calcium]     Leg pain  . Ramipril     cough  . Simvastatin     Week hourse voice  . Zetia [Ezetimibe] Other (See Comments)    Muscle pain    ROS: Pertinent items noted in HPI and remainder of comprehensive ROS otherwise negative.  HOME MEDS: Current Outpatient Medications  Medication Sig Dispense Refill  . aspirin 81 MG tablet Take 81 mg by mouth daily.      . Evolocumab (REPATHA [place]) Inject into the skin.    Marland Kitchen losartan (COZAAR) 100 MG tablet TAKE 1 TABLET BY MOUTH ONCE DAILY 90 tablet 0  . metoprolol tartrate (LOPRESSOR) 25 MG tablet TAKE 1 TABLET BY MOUTH TWICE DAILY MUST  KEEP  SCHEDULE  APPOINTMENT  FOR  FUTURE  REFILLS 60 tablet 1  . Multiple Vitamin (MULTIVITAMIN) tablet Take 1 tablet by mouth daily.       No current facility-administered medications for this visit.     LABS/IMAGING: No results found for this or any previous visit (from the past 48 hour(s)). No results found.  WEIGHTS: Wt Readings from Last 3 Encounters:  03/06/18 131 lb 9.6 oz (59.7 kg)  11/15/17 133 lb (60.3 kg)  11/10/17 133 lb (60.3 kg)    VITALS: BP (!) 142/78   Pulse 75   Ht 5\' 1"  (1.549 m)   Wt 131 lb 9.6 oz (59.7 kg)   SpO2 96%   BMI 24.87 kg/m   EXAM: Deferred  EKG: deferred  ASSESSMENT: 1. CAD status post four-vessel CABG (2009) 2. Dyslipidemia 3. Hypertension 4. Statin intolerant  PLAN: 1.   [name] is tolerating Repatha well with marked improvement in her dyslipidemia.  Her LDL cholesterol slightly above goal of less than 70.  She should continue to work on diet and reducing saturated fats.  No changes to her medications today.  Overall this should reduce her cardiovascular risk going forward.  Plan follow-up with me in 6 months with a lipid profile.  Christine Nose, MD, [facility], FACP    [place]  [facility]  Medical Director of the Advanced Lipid Disorders &  Cardiovascular Risk Reduction Clinic Diplomate of the American Board of Clinical Lipidology Attending Cardiologist  Direct Dial: [phone]  Fax: [phone]  Website:  [email or web address] Reinhardt Licausi 03/06/2018, 10:31 AM

## 2018-03-09 ENCOUNTER — Encounter: Payer: Self-pay | Admitting: Internal Medicine

## 2018-03-14 ENCOUNTER — Ambulatory Visit
Admission: RE | Admit: 2018-03-14 | Discharge: 2018-03-14 | Disposition: A | Discharge status: Home / Self Care | Payer: Medicare HMO | Source: Ambulatory Visit | Attending: Obstetrics and Gynecology | Admitting: Obstetrics and Gynecology

## 2018-03-14 DIAGNOSIS — Z1231 Encounter for screening mammogram for malignant neoplasm of breast: Secondary | ICD-10-CM | POA: Diagnosis not present

## 2018-05-02 DIAGNOSIS — Z90712 Acquired absence of cervix with remaining uterus: Secondary | ICD-10-CM | POA: Diagnosis not present

## 2018-05-02 DIAGNOSIS — Z6824 Body mass index (BMI) 24.0-24.9, adult: Secondary | ICD-10-CM | POA: Diagnosis not present

## 2018-05-02 DIAGNOSIS — Z1272 Encounter for screening for malignant neoplasm of vagina: Secondary | ICD-10-CM | POA: Diagnosis not present

## 2018-05-02 DIAGNOSIS — Z124 Encounter for screening for malignant neoplasm of cervix: Secondary | ICD-10-CM | POA: Diagnosis not present

## 2018-05-09 ENCOUNTER — Telehealth: Payer: Self-pay | Admitting: Internal Medicine

## 2018-05-09 MED ORDER — LOSARTAN POTASSIUM 100 MG PO TABS: 100.0000 mg | ORAL_TABLET | Freq: Every day | ORAL | 5 refills | Status: DC

## 2018-05-09 NOTE — Telephone Encounter (Signed)
New Message    *STAT* If patient is at the pharmacy, call can be transferred to refill team.   1. Which medications need to be refilled? (please list name of each medication and dose if known) losartan (COZAAR) 100 MG tablet  2. Which pharmacy/location (including street and city if local pharmacy) is medication to be sent to? [facility] [address] - [place], [place] - [id number]  3. Do they need a 30 day or 90 day supply?

## 2018-05-16 ENCOUNTER — Telehealth: Payer: Self-pay | Admitting: Internal Medicine

## 2018-05-16 NOTE — Telephone Encounter (Signed)
Mailed patient application for repatha patient assistance to be completed and returned for submission for 2020  PA to be completed with most recent set of labs

## 2018-05-30 NOTE — Telephone Encounter (Signed)
PA for Repatha submitted via covermymeds.com  (Key: [id number]: [id number])    Spoke with patient who states she dropped off patient assistance application yesterday. This has been obtained. Will await outcome of PA.

## 2018-05-31 NOTE — Telephone Encounter (Signed)
PA Case: [id number] Status: Approved Coverage Starts on: 06/18/2018 Coverage Ends on: 06/20/2019.

## 2018-05-31 NOTE — Telephone Encounter (Signed)
Patient notified that application received for patient assistance was the outdated version. She will have her son come by tomorrow to pick up new application to be signed and returned.

## 2018-06-04 NOTE — Telephone Encounter (Signed)
Faxed patient assistance application for Repatha 2020 to [employer] @ [phone]

## 2018-06-11 NOTE — Telephone Encounter (Signed)
Received notice from [employer] that patient has been approved for assistance for Repatha from 06/20/2018 - 06/20/2019.

## 2018-08-03 ENCOUNTER — Other Ambulatory Visit: Payer: Self-pay | Admitting: Internal Medicine

## 2018-09-01 ENCOUNTER — Other Ambulatory Visit: Payer: Self-pay | Admitting: Internal Medicine

## 2018-10-19 ENCOUNTER — Telehealth: Payer: Self-pay | Admitting: Internal Medicine

## 2018-10-19 NOTE — Telephone Encounter (Signed)
LMTCB to change appt to virtual visit with Dr. Hilty only if patient obtained labs first, otherwise patient needs to r/s to another lipid clinic day. °

## 2018-10-22 NOTE — Telephone Encounter (Signed)
Spoke with patient who wishes to cancel 10/23/2018 visit and schedule in-office visit in August 2020. She can come in on any day except Thursday. She is aware a scheduler will contact her to r/s this appointment and that she needs fasting labs prior to visit. She is doing well on cholesterol medication.

## 2018-10-23 ENCOUNTER — Ambulatory Visit: Payer: Medicare HMO | Admitting: Internal Medicine

## 2018-12-03 ENCOUNTER — Other Ambulatory Visit: Payer: Self-pay | Admitting: Internal Medicine

## 2019-02-09 ENCOUNTER — Other Ambulatory Visit: Payer: Self-pay | Admitting: Internal Medicine

## 2019-02-13 ENCOUNTER — Other Ambulatory Visit: Payer: Self-pay

## 2019-02-13 ENCOUNTER — Ambulatory Visit: Payer: Medicare HMO | Admitting: Internal Medicine

## 2019-02-13 ENCOUNTER — Encounter: Payer: Self-pay | Admitting: Internal Medicine

## 2019-02-13 VITALS — BP 114/82 | HR 67 | Temp 97.0°F | Ht 61.0 in | Wt 127.4 lb

## 2019-02-13 DIAGNOSIS — Z789 Other specified health status: Secondary | ICD-10-CM

## 2019-02-13 DIAGNOSIS — E785 Hyperlipidemia, unspecified: Secondary | ICD-10-CM | POA: Diagnosis not present

## 2019-02-13 DIAGNOSIS — I2583 Coronary atherosclerosis due to lipid rich plaque: Secondary | ICD-10-CM | POA: Diagnosis not present

## 2019-02-13 DIAGNOSIS — I10 Essential (primary) hypertension: Secondary | ICD-10-CM

## 2019-02-13 DIAGNOSIS — I2510 Atherosclerotic heart disease of native coronary artery without angina pectoris: Secondary | ICD-10-CM | POA: Diagnosis not present

## 2019-02-13 DIAGNOSIS — Z951 Presence of aortocoronary bypass graft: Secondary | ICD-10-CM

## 2019-02-13 LAB — LIPID PANEL
Chol/HDL Ratio: 2.9 ratio (ref 0.0–4.4)
Cholesterol, Total: 176 mg/dL (ref 100–199)
HDL: 61 mg/dL (ref >39)
LDL Calculated: 93 mg/dL (ref 0–99)
Triglycerides: 111 mg/dL (ref 0–149)
VLDL Cholesterol Cal: 22 mg/dL (ref 5–40)

## 2019-02-13 MED ORDER — LOSARTAN POTASSIUM-HCTZ 100-12.5 MG PO TABS: 1.0000 | ORAL_TABLET | Freq: Every day | ORAL | 3 refills | Status: DC

## 2019-02-13 NOTE — Patient Instructions (Signed)
Medication Instructions:  STOP separate pills of losartan & hydrochlorothiazide and take COMBINATION pill If you need a refill on your cardiac medications before your next appointment, please call your pharmacy.   Lab work: FASTING lab work today If you have labs (blood work) drawn today and your tests are completely normal, you will receive your results only by: Marland Kitchen MyChart Message (if you have MyChart) OR . A paper copy in the mail If you have any lab test that is abnormal or we need to change your treatment, we will call you to review the results.  Testing/Procedures: NONE  Follow-Up: Dr. Debara Pickett recommends that you schedule a follow up visit with him the in the [place] in 12 months.  -- please call June 2021 to schedule for August 2021

## 2019-02-13 NOTE — Progress Notes (Signed)
OFFICE NOTE  Chief Complaint:  Follow-up dyslipidemia and coronary disease  Primary Care Physician: Catha Gosselin, MD  HPI:  [name] is a 84 y.o. female who was a former patient of Dr. Patty Sermons. She is followed for a history of hypertension and dyslipidemia. She also has a history of coronary artery disease and is status post coronary artery bypass grafting in 2009 x 4 by Dr. Laneta Simmers (LIMA to LAD, sequential SVG to the first diagonal and second and third obtuse marginal branches of the left circumflex, and SVG to the posterior descending branch of the right coronary). She is generally been asymptomatic since that time. She has not undergone any routine stress testing or echocardiography. Blood pressures been well controlled. Cholesterol is at goal on low-dose Zocor.  09/30/2016  [name] was seen today in follow-up. She seems to be doing well without any new complaints. Blood pressure was excellent today 132/70. She says that she's had intolerance of statins in the past. She reports myalgias with Lipitor and to a lesser extent Crestor and she developed some hoarseness in her voice with simvastatin. She has been able to tolerate 10 mg a simvastatin. We recently repeated a lipid profile showed total cholesterol 172, HDL 62, LDL 88 and triglycerides 110. I initially advised her to increase for simvastatin however she is little hesitant to do that. We had a nice discussion about that in the office today. Apparently she's been on Zetia in the past with even worse symptoms of muscle pain than with her Lipitor. She's also previously tried fish oil and niacin. We discussed the possibility of asymptomatic stress testing because of her bypass grafts being quite old at this point however she declined at this time.  11/10/2017  [name] was seen today in follow-up.  She is done well over the past year.  She is now 10 years out from coronary artery bypass grafting in 2009.  She denies chest pain  or worsening shortness of breath.  She has not had interval assessment of her bypass grafts with stress testing.  She had a well-controlled lipid profile on simvastatin, however discontinued this due to side effects including vocal hoarseness.  She was switched to rosuvastatin which time she developed leg pain both on the 5 and 10 mg doses.  She is previously been on ezetimibe and developed muscle pain.  Based on this she is deemed statin intolerant.  She does have ASCVD with four-vessel bypass in 2009 and has a goal LDL less than 70.  She is not likely to achieve that goal without additional therapy.  Unfortunately, she just had a repeat lipid profile which showed total cholesterol 231, triglycerides 131, HDL 57 and LDL 148 (off of therapy).  Based on this, I think she is a good candidate for PCSK9 inhibitor therapy.  We discussed this today and will pursue Repatha.  03/06/2018  [name] is seen today in follow-up.  She was felt to be a good candidate for PCSK9 and therapy and has started on Repatha.  I am pleased to report marked improvement in her dyslipidemia.  Her total cholesterol is decreased from 2 31-1 49.  Triglycerides have decreased from 131-1 14, HDL is increased from 54-57 and LDL cholesterol has decreased from 148-72.  She is tolerating the medication without any significant side effects.  This puts her slightly above goal LDL less than 70.  02/13/2019  [name] returns today for follow-up.  She denies any more chest pain or  worsening shortness of breath.  I have been working with her on coronary disease as well as dyslipidemia and had recommended PCSK9 inhibitor therapy.  Fortunately she had significant reduction in LDL cholesterol down to 72 on treatment and had been doing well without side effects.  She returns now 6 months later.  Overall she feels well.  Her blood pressures been well controlled.  We had discussed with the addition of HCTZ that ultimately we may combine her losartan  with the HCTZ for ease of administration.  She is due for repeat lipids.  PMHx:  Past Medical History:  Diagnosis Date  . Cataract   . Coronary artery disease   . Hypercholesterolemia   . Hypertension    ESSENTIAL  . Myalgia   . Nausea     Past Surgical History:  Procedure Laterality Date  . CORONARY ARTERY BYPASS GRAFT      FAMHx:  Family History  Problem Relation Age of Onset  . Stroke Mother 7375  . Cancer Father        lung cancer  . Uterine cancer Sister 7279  . Dementia Sister   . Dementia Sister   . Hypertension Sister   . Stroke Sister 7389  . Heart disease Sister 6279    SOCHx:   reports that she has never smoked. She has never used smokeless tobacco. She reports that she does not drink alcohol or use drugs.  ALLERGIES:  Allergies  Allergen Reactions  . Amiodarone     rash  . Crestor [Rosuvastatin Calcium]     Leg pain  . Ramipril     cough  . Simvastatin     Week hourse voice  . Zetia [Ezetimibe] Other (See Comments)    Muscle pain    ROS: Pertinent items noted in HPI and remainder of comprehensive ROS otherwise negative.  HOME MEDS: Current Outpatient Medications  Medication Sig Dispense Refill  . aspirin 81 MG tablet Take 81 mg by mouth daily.      . Evolocumab (REPATHA [place]) 140 MG/ML SOAJ Inject 1 Dose into the skin every 14 (fourteen) days.    . metoprolol tartrate (LOPRESSOR) 25 MG tablet Take 1 tablet (25 mg total) by mouth 2 (two) times daily. 180 tablet 3  . Multiple Vitamin (MULTIVITAMIN) tablet Take 1 tablet by mouth daily.      Marland Kitchen. losartan-hydrochlorothiazide (HYZAAR) 100-12.5 MG tablet Take 1 tablet by mouth daily. 90 tablet 3   No current facility-administered medications for this visit.     LABS/IMAGING: No results found for this or any previous visit (from the past 48 hour(s)). No results found.  WEIGHTS: Wt Readings from Last 3 Encounters:  02/13/19 127 lb 6.4 oz (57.8 kg)  03/06/18 131 lb 9.6 oz (59.7 kg)  11/15/17 133  lb (60.3 kg)    VITALS: BP 114/82   Pulse 67   Temp (!) 97 F (36.1 C)   Ht 5\' 1"  (1.549 m)   Wt 127 lb 6.4 oz (57.8 kg)   SpO2 98%   BMI 24.07 kg/m   EXAM: General appearance: alert and no distress Neck: no carotid bruit, no JVD and thyroid not enlarged, symmetric, no tenderness/mass/nodules Lungs: clear to auscultation bilaterally Heart: regular rate and rhythm, S1, S2 normal, no murmur, click, rub or gallop Abdomen: soft, non-tender; bowel sounds normal; no masses,  no organomegaly Extremities: extremities normal, atraumatic, no cyanosis or edema Pulses: 2+ and symmetric Skin: Skin color, texture, turgor normal. No rashes or lesions Neurologic: Grossly normal  Psych: Pleasant  EKG: deferred  ASSESSMENT: 1. CAD status post four-vessel CABG (2009) 2. Dyslipidemia 3. Hypertension 4. Statin intolerant  PLAN: 1.   [name] is tolerating Repatha and was near goal LDL less than 70.  We will plan a repeat lipid profile today.  Her blood pressure has also been well controlled and she requested to combine her losartan and HCTZ to 1 medication for ease in administration.  Otherwise no changes to her medicines today.  I encouraged continued healthy eating and exercise.  Follow-up with me annually or sooner as necessary.  Pixie Casino, MD, [facility], [place] Director of the Advanced Lipid Disorders &  Cardiovascular Risk Reduction Clinic Diplomate of the American Board of Clinical Lipidology Attending Cardiologist  Direct Dial: [phone]  Fax: [phone]  Website:  [email or web address] 02/13/2019, 12:35 PM

## 2019-02-18 ENCOUNTER — Encounter: Payer: Self-pay | Admitting: Internal Medicine

## 2019-02-19 ENCOUNTER — Other Ambulatory Visit: Payer: Self-pay | Admitting: Obstetrics and Gynecology

## 2019-02-19 DIAGNOSIS — Z1231 Encounter for screening mammogram for malignant neoplasm of breast: Secondary | ICD-10-CM

## 2019-02-26 ENCOUNTER — Other Ambulatory Visit: Payer: Self-pay | Admitting: Internal Medicine

## 2019-03-12 ENCOUNTER — Other Ambulatory Visit: Payer: Self-pay | Admitting: Internal Medicine

## 2019-04-05 ENCOUNTER — Other Ambulatory Visit: Payer: Self-pay

## 2019-04-05 ENCOUNTER — Ambulatory Visit
Admission: RE | Admit: 2019-04-05 | Discharge: 2019-04-05 | Disposition: A | Discharge status: Home / Self Care | Payer: Medicare HMO | Source: Ambulatory Visit | Attending: Obstetrics and Gynecology | Admitting: Obstetrics and Gynecology

## 2019-04-05 DIAGNOSIS — Z1231 Encounter for screening mammogram for malignant neoplasm of breast: Secondary | ICD-10-CM

## 2019-04-06 ENCOUNTER — Other Ambulatory Visit: Payer: Self-pay | Admitting: Internal Medicine

## 2019-04-24 ENCOUNTER — Telehealth: Payer: Self-pay | Admitting: Internal Medicine

## 2019-04-24 NOTE — Telephone Encounter (Signed)
repatha patient assistance application mailed with instruction to return & complete 

## 2019-05-02 ENCOUNTER — Telehealth: Payer: Self-pay | Admitting: Internal Medicine

## 2019-05-02 NOTE — Telephone Encounter (Signed)
Attempted PA for repatha on covermymeds.[email or web address]  [employer] [employer] is not able to process this request through [place], please contact the plan at [phone] or fax in request to [phone].

## 2019-05-03 NOTE — Telephone Encounter (Signed)
Called insurance company to initiate prior auth for Repatha  30 minutes on call  Medication approved 06/21/2019-06/19/2020 

## 2019-05-06 NOTE — Telephone Encounter (Signed)
Application for Repatha patient assistance faxed to [place]

## 2019-05-08 DIAGNOSIS — Z01419 Encounter for gynecological examination (general) (routine) without abnormal findings: Secondary | ICD-10-CM | POA: Diagnosis not present

## 2019-05-08 DIAGNOSIS — Z6825 Body mass index (BMI) 25.0-25.9, adult: Secondary | ICD-10-CM | POA: Diagnosis not present

## 2019-05-14 NOTE — Telephone Encounter (Signed)
Patient returned call. Provided info as noted below. Asked for call back once approved for patient assistance thru a different program so we can document for future reference.

## 2019-05-14 NOTE — Telephone Encounter (Signed)
LMTCB to discuss repatha patient assistance denial from [employer]. Application denied b/c insurance indicated there is coverage fr this medication.   Patient will need to call repatha ready @ [phone] and/or apply for assistance from healthwellfoundation.[email or web address] >> disease funds >> hypercholesterolemia.

## 2019-07-08 ENCOUNTER — Other Ambulatory Visit: Payer: Self-pay | Admitting: Internal Medicine

## 2019-09-26 ENCOUNTER — Other Ambulatory Visit: Payer: Self-pay | Admitting: Internal Medicine

## 2019-10-24 ENCOUNTER — Telehealth: Payer: Self-pay | Admitting: Internal Medicine

## 2019-10-24 DIAGNOSIS — E785 Hyperlipidemia, unspecified: Secondary | ICD-10-CM

## 2019-10-24 NOTE — Telephone Encounter (Signed)
New Message    Pt c/o medication issue:  1. Name of Medication:   Evolocumab (REPATHA [place]) 140 MG/ML SOAJ     2. How are you currently taking this medication (dosage and times per day)?  1 dose in 14 days   3. Are you having a reaction (difficulty breathing--STAT)? No   4. What is your medication issue? Pt is calling and says she wants to stop Repatha and start on a pill form medication     Please advise

## 2019-10-24 NOTE — Telephone Encounter (Signed)
Returned call to patient who sees Dr. Rennis Golden for lipid clinic She state she does not want to take shots anymore, she is tired of injections She reports atorvastatin caused myalgias, others listed as "allergies" caused her to lose her voice She also reports Repatha has caused her to lose her voice, she has been off med for 2 months, and her voice is still not back She has not had a lipid panel since August 2020, per her report  Will route to MD concerning her request for med change - notified patient she may need a lab test done prior to any changes

## 2019-10-25 MED ORDER — SIMVASTATIN 20 MG PO TABS: 20.0000 mg | ORAL_TABLET | Freq: Every day | ORAL | 3 refills | Status: DC

## 2019-10-25 NOTE — Telephone Encounter (Signed)
Simvastatin 20 mg daily, repeat lipid in 3 months

## 2019-10-25 NOTE — Telephone Encounter (Signed)
Not really - if she decides she is not tired of taking shots or willing to try something else, she can get back to [place] for possible options, though they are limited.

## 2019-10-25 NOTE — Telephone Encounter (Signed)
Would you like to have her repeat a lipid panel and then suggest a different medication then?

## 2019-10-25 NOTE — Telephone Encounter (Signed)
Patient states she is willing to try simvastatin again Although listed as intolerance, this was the most "agreeable" for her  Sent to MD to advise on med/dose  Pharmacy: [facility]

## 2019-10-25 NOTE — Telephone Encounter (Signed)
Patient agreeable with MD plan/recommendations. Rx(s) sent to pharmacy electronically. Lab order will be mailed

## 2019-10-25 NOTE — Telephone Encounter (Signed)
Ok . .that is her choice.  Dr Rexene Edison

## 2019-11-05 DIAGNOSIS — J310 Chronic rhinitis: Secondary | ICD-10-CM | POA: Diagnosis not present

## 2019-11-05 DIAGNOSIS — H938X9 Other specified disorders of ear, unspecified ear: Secondary | ICD-10-CM | POA: Diagnosis not present

## 2019-11-20 DIAGNOSIS — H9193 Unspecified hearing loss, bilateral: Secondary | ICD-10-CM | POA: Diagnosis not present

## 2019-11-22 ENCOUNTER — Ambulatory Visit (INDEPENDENT_AMBULATORY_CARE_PROVIDER_SITE_OTHER): Payer: Medicare HMO | Admitting: Otolaryngology

## 2019-11-22 ENCOUNTER — Other Ambulatory Visit: Payer: Self-pay

## 2019-11-22 ENCOUNTER — Encounter (INDEPENDENT_AMBULATORY_CARE_PROVIDER_SITE_OTHER): Payer: Self-pay | Admitting: Otolaryngology

## 2019-11-22 VITALS — Temp 97.2°F

## 2019-11-22 DIAGNOSIS — H903 Sensorineural hearing loss, bilateral: Secondary | ICD-10-CM | POA: Diagnosis not present

## 2019-11-22 DIAGNOSIS — H6123 Impacted cerumen, bilateral: Secondary | ICD-10-CM | POA: Diagnosis not present

## 2019-11-22 NOTE — Progress Notes (Signed)
HPI: [name] is a 85 y.o. female who presents is referred by her PCP for evaluation of decreased hearing in both ears.  She has used Q-tips to clean her ears.  She denies any ear pain or drainage from her ears.  No dizziness or vertigo..  Past Medical History:  Diagnosis Date  . Cataract   . Coronary artery disease   . Hypercholesterolemia   . Hypertension    ESSENTIAL  . Myalgia   . Nausea    Past Surgical History:  Procedure Laterality Date  . CORONARY ARTERY BYPASS GRAFT     Social History   Socioeconomic History  . Marital status: Widowed    Spouse name: Not on file  . Number of children: 1  . Years of education: 15  . Highest education level: Not on file  Occupational History  . Occupation: retired    Fish farm manager: [employer]  Tobacco Use  . Smoking status: Never Smoker  . Smokeless tobacco: Never Used  Substance and Sexual Activity  . Alcohol use: No  . Drug use: No  . Sexual activity: Not on file  Other Topics Concern  . Not on file  Social History Narrative  . Not on file   Social Determinants of Health   Financial Resource Strain:   . Difficulty of Paying Living Expenses:   Food Insecurity:   . Worried About Charity fundraiser in the Last Year:   . Arboriculturist in the Last Year:   Transportation Needs:   . Film/video editor (Medical):   Marland Kitchen Lack of Transportation (Non-Medical):   Physical Activity:   . Days of Exercise per Week:   . Minutes of Exercise per Session:   Stress:   . Feeling of Stress :   Social Connections:   . Frequency of Communication with Friends and Family:   . Frequency of Social Gatherings with Friends and Family:   . Attends Religious Services:   . Active Member of Clubs or Organizations:   . Attends Archivist Meetings:   Marland Kitchen Marital Status:    Family History  Problem Relation Age of Onset  . Stroke Mother 36  . Cancer Father        lung cancer  . Uterine cancer Sister 53  . Dementia  Sister   . Dementia Sister   . Hypertension Sister   . Stroke Sister 47  . Heart disease Sister 54   Allergies  Allergen Reactions  . Amiodarone     rash  . Crestor [Rosuvastatin Calcium]     Leg pain  . Ramipril     cough  . Simvastatin     Week hourse voice  . Zetia [Ezetimibe] Other (See Comments)    Muscle pain   Prior to Admission medications   Medication Sig Start Date End Date Taking? Authorizing Provider  aspirin 81 MG tablet Take 81 mg by mouth daily.     Yes [provider]  Evolocumab (REPATHA SURECLICK) 829 MG/ML SOAJ Inject 1 Dose into the skin every 14 (fourteen) days.   Yes [provider]  losartan-hydrochlorothiazide (HYZAAR) 100-12.5 MG tablet Take 1 tablet by mouth daily. 02/13/19  Yes Hilty, Nadean Corwin, MD  metoprolol tartrate (LOPRESSOR) 25 MG tablet Take 1 tablet by mouth twice daily 09/27/19  Yes Hilty, Nadean Corwin, MD  Multiple Vitamin (MULTIVITAMIN) tablet Take 1 tablet by mouth daily.     Yes [provider]  simvastatin (ZOCOR) 20 MG  tablet Take 1 tablet (20 mg total) by mouth at bedtime. 10/25/19 01/23/20 Yes Hilty, Lisette Abu, MD     Positive ROS: Otherwise negative  All other systems have been reviewed and were otherwise negative with the exception of those mentioned in the HPI and as above.  Physical Exam: Constitutional: Alert, well-appearing, no acute distress Ears: External ears without lesions or tenderness.  She has impacted cerumen adjacent to both TMs secondary to Q-tip use.  This was removed in the office today using irrigation as well as hydroperoxide and suction and forceps.  After removing the cerumen the hearing was much better.  TMs were clear bilaterally. Nasal: External nose without lesions. Septum midline.. Clear nasal passages Oral: Lips and gums without lesions. Tongue and palate mucosa without lesions. Posterior oropharynx clear. Neck: No palpable adenopathy or masses Respiratory: Breathing comfortably  Skin:  No facial/neck lesions or rash noted.  Audiogram demonstrated normal hearing in the lower frequencies up to 2000 frequency bilaterally she has downsloping sensorineural hearing loss in both ears in the higher frequencies above 3000.  Her SRT's were 20 Db bilaterally.  She had type A tympanograms bilaterally.  Cerumen impaction removal  Date/Time: 11/22/2019 3:55 PM Performed by: Drema Halon, MD Authorized by: Drema Halon, MD   Consent:    Consent obtained:  Verbal   Consent given by:  Patient   Risks discussed:  Pain and bleeding Procedure details:    Location:  L ear and R ear   Procedure type: curette, irrigation, suction and forceps   Post-procedure details:    Inspection:  TM intact and canal normal   Hearing quality:  Improved   Patient tolerance of procedure:  Tolerated well, no immediate complications Comments:     TMs are clear bilaterally.    Assessment: Bilateral cerumen impactions. Bilateral high-frequency sensorineural hearing loss consistent with presbycusis.  Plan: Her hearing was much better after cleaning the ears. She will follow-up as needed.  I cautioned her about not using Q-tips in the future in the ear canals.   Narda Bonds, MD   CC:

## 2019-11-25 ENCOUNTER — Encounter (INDEPENDENT_AMBULATORY_CARE_PROVIDER_SITE_OTHER): Payer: Self-pay

## 2019-12-11 DIAGNOSIS — L0390 Cellulitis, unspecified: Secondary | ICD-10-CM | POA: Diagnosis not present

## 2019-12-11 DIAGNOSIS — R229 Localized swelling, mass and lump, unspecified: Secondary | ICD-10-CM | POA: Diagnosis not present

## 2019-12-16 DIAGNOSIS — S6992XA Unspecified injury of left wrist, hand and finger(s), initial encounter: Secondary | ICD-10-CM | POA: Diagnosis not present

## 2020-01-02 DIAGNOSIS — M25532 Pain in left wrist: Secondary | ICD-10-CM | POA: Diagnosis not present

## 2020-01-06 ENCOUNTER — Telehealth: Payer: Self-pay | Admitting: Internal Medicine

## 2020-01-06 ENCOUNTER — Other Ambulatory Visit: Payer: Self-pay | Admitting: Internal Medicine

## 2020-01-06 NOTE — Telephone Encounter (Signed)
Pt c/o BP issue: STAT if pt c/o blurred vision, one-sided weakness or slurred speech  1. What are your last 5 BP readings? 118/73 HR 116, 113/65 HR 75 , 99/70 HR 131  2. Are you having any other symptoms (ex. Dizziness, headache, blurred vision, passed out)? Feels like she is going to pass out and dizziness.   3. What is your BP issue? Patient states that her BP keeps going down. She is concerned with the bottom number.

## 2020-01-06 NOTE — Telephone Encounter (Signed)
Agree - monitor BP, could adjust meds if BP is low.  Dr Rexene Edison

## 2020-01-06 NOTE — Telephone Encounter (Signed)
Spoke with pt, she reports she started feeling this way today. She feels faint and having dizziness. She denies other symptoms. She is wanting to know if the losartan-HCT needs to be changed to take the HCY taken out. Aware we do not usually make changes in medications based on one reading. She would like to wait and see if it gets better tomorrow. Advised I will make dr hilty aware. Pt agreed with this plan.

## 2020-02-06 DIAGNOSIS — E785 Hyperlipidemia, unspecified: Secondary | ICD-10-CM | POA: Diagnosis not present

## 2020-02-06 LAB — LIPID PANEL
Chol/HDL Ratio: 2.9 ratio (ref 0.0–4.4)
Cholesterol, Total: 164 mg/dL (ref 100–199)
HDL: 56 mg/dL (ref >39)
LDL Chol Calc (NIH): 90 mg/dL (ref 0–99)
Triglycerides: 100 mg/dL (ref 0–149)
VLDL Cholesterol Cal: 18 mg/dL (ref 5–40)

## 2020-02-10 ENCOUNTER — Encounter: Payer: Self-pay | Admitting: Internal Medicine

## 2020-03-05 ENCOUNTER — Other Ambulatory Visit: Payer: Self-pay | Admitting: Obstetrics and Gynecology

## 2020-03-05 DIAGNOSIS — Z1231 Encounter for screening mammogram for malignant neoplasm of breast: Secondary | ICD-10-CM

## 2020-04-05 ENCOUNTER — Other Ambulatory Visit: Payer: Self-pay | Admitting: Internal Medicine

## 2020-04-06 ENCOUNTER — Other Ambulatory Visit: Payer: Self-pay

## 2020-04-06 ENCOUNTER — Ambulatory Visit
Admission: RE | Admit: 2020-04-06 | Discharge: 2020-04-06 | Disposition: A | Discharge status: Home / Self Care | Payer: Medicare HMO | Source: Ambulatory Visit | Attending: Obstetrics and Gynecology | Admitting: Obstetrics and Gynecology

## 2020-04-06 DIAGNOSIS — Z1231 Encounter for screening mammogram for malignant neoplasm of breast: Secondary | ICD-10-CM

## 2020-04-30 ENCOUNTER — Encounter: Payer: Self-pay | Admitting: Internal Medicine

## 2020-04-30 ENCOUNTER — Ambulatory Visit (INDEPENDENT_AMBULATORY_CARE_PROVIDER_SITE_OTHER): Payer: Medicare HMO | Admitting: Internal Medicine

## 2020-04-30 VITALS — BP 118/62 | HR 84 | Ht 61.0 in | Wt 131.6 lb

## 2020-04-30 DIAGNOSIS — I10 Essential (primary) hypertension: Secondary | ICD-10-CM

## 2020-04-30 DIAGNOSIS — Z951 Presence of aortocoronary bypass graft: Secondary | ICD-10-CM

## 2020-04-30 DIAGNOSIS — I2510 Atherosclerotic heart disease of native coronary artery without angina pectoris: Secondary | ICD-10-CM

## 2020-04-30 DIAGNOSIS — I2583 Coronary atherosclerosis due to lipid rich plaque: Secondary | ICD-10-CM

## 2020-04-30 DIAGNOSIS — M791 Myalgia, unspecified site: Secondary | ICD-10-CM

## 2020-04-30 DIAGNOSIS — E785 Hyperlipidemia, unspecified: Secondary | ICD-10-CM | POA: Diagnosis not present

## 2020-04-30 DIAGNOSIS — T466X5A Adverse effect of antihyperlipidemic and antiarteriosclerotic drugs, initial encounter: Secondary | ICD-10-CM

## 2020-04-30 MED ORDER — SIMVASTATIN 40 MG PO TABS: 40.0000 mg | ORAL_TABLET | Freq: Every day | ORAL | 3 refills | Status: DC

## 2020-04-30 NOTE — Progress Notes (Signed)
OFFICE NOTE  Chief Complaint:  Follow-up dyslipidemia and coronary disease  Primary Care Physician: Christine Gosselin, MD  HPI:  [name] is a 85 y.o. female who was a former patient of Dr. Patty Sermons. She is followed for a history of hypertension and dyslipidemia. She also has a history of coronary artery disease and is status post coronary artery bypass grafting in 2009 x 4 by Dr. Laneta Simmers (LIMA to LAD, sequential SVG to the first diagonal and second and third obtuse marginal branches of the left circumflex, and SVG to the posterior descending branch of the right coronary). She is generally been asymptomatic since that time. She has not undergone any routine stress testing or echocardiography. Blood pressures been well controlled. Cholesterol is at goal on low-dose Zocor.  09/30/2016  [name] was seen today in follow-up. She seems to be doing well without any new complaints. Blood pressure was excellent today 132/70. She says that she's had intolerance of statins in the past. She reports myalgias with Lipitor and to a lesser extent Crestor and she developed some hoarseness in her voice with simvastatin. She has been able to tolerate 10 mg a simvastatin. We recently repeated a lipid profile showed total cholesterol 172, HDL 62, LDL 88 and triglycerides 110. I initially advised her to increase for simvastatin however she is little hesitant to do that. We had a nice discussion about that in the office today. Apparently she's been on Zetia in the past with even worse symptoms of muscle pain than with her Lipitor. She's also previously tried fish oil and niacin. We discussed the possibility of asymptomatic stress testing because of her bypass grafts being quite old at this point however she declined at this time.  11/10/2017  [name] was seen today in follow-up.  She is done well over the past year.  She is now 10 years out from coronary artery bypass grafting in 2009.  She denies chest pain  or worsening shortness of breath.  She has not had interval assessment of her bypass grafts with stress testing.  She had a well-controlled lipid profile on simvastatin, however discontinued this due to side effects including vocal hoarseness.  She was switched to rosuvastatin which time she developed leg pain both on the 5 and 10 mg doses.  She is previously been on ezetimibe and developed muscle pain.  Based on this she is deemed statin intolerant.  She does have ASCVD with four-vessel bypass in 2009 and has a goal LDL less than 70.  She is not likely to achieve that goal without additional therapy.  Unfortunately, she just had a repeat lipid profile which showed total cholesterol 231, triglycerides 131, HDL 57 and LDL 148 (off of therapy).  Based on this, I think she is a good candidate for PCSK9 inhibitor therapy.  We discussed this today and will pursue Repatha.  03/06/2018  [name] is seen today in follow-up.  She was felt to be a good candidate for PCSK9 and therapy and has started on Repatha.  I am pleased to report marked improvement in her dyslipidemia.  Her total cholesterol is decreased from 2 31-1 49.  Triglycerides have decreased from 131-1 14, HDL is increased from 54-57 and LDL cholesterol has decreased from 148-72.  She is tolerating the medication without any significant side effects.  This puts her slightly above goal LDL less than 70.  02/13/2019  [name] returns today for follow-up.  She denies any more chest pain or  worsening shortness of breath.  I have been working with her on coronary disease as well as dyslipidemia and had recommended PCSK9 inhibitor therapy.  Fortunately she had significant reduction in LDL cholesterol down to 72 on treatment and had been doing well without side effects.  She returns now 6 months later.  Overall she feels well.  Her blood pressures been well controlled.  We had discussed with the addition of HCTZ that ultimately we may combine her losartan  with the HCTZ for ease of administration.  She is due for repeat lipids.  04/30/2020  [name] seen today in follow-up.  Overall she is doing well from a cardiac standpoint.  She denies any chest pain or worsening shortness of breath.  Unfortunately she recently stopped using the PCSK9 hitters.  She said she was "tired of giving herself injections".  She was agreeable to go back on a statin medication which she said in the past that caused her a hoarse voice, namely simvastatin.  I believe she was on 40 mg previously but we started 20 mg daily.  She has been on that for several months since May 2021 and repeat lipids in August showed total cholesterol 164, HDL 56, LDL 90 and triglycerides 100.  Overall pretty good control however her target is less than 70 for LDL.  PMHx:  Past Medical History:  Diagnosis Date  . Cataract   . Coronary artery disease   . Hypercholesterolemia   . Hypertension    ESSENTIAL  . Myalgia   . Nausea     Past Surgical History:  Procedure Laterality Date  . CORONARY ARTERY BYPASS GRAFT      FAMHx:  Family History  Problem Relation Age of Onset  . Stroke Mother 49  . Cancer Father        lung cancer  . Uterine cancer Sister 22  . Dementia Sister   . Dementia Sister   . Hypertension Sister   . Stroke Sister 10  . Heart disease Sister 19    SOCHx:   reports that she has never smoked. She has never used smokeless tobacco. She reports that she does not drink alcohol and does not use drugs.  ALLERGIES:  Allergies  Allergen Reactions  . Amiodarone     rash  . Crestor [Rosuvastatin Calcium]     Leg pain  . Ramipril     cough  . Simvastatin     Week hourse voice  . Zetia [Ezetimibe] Other (See Comments)    Muscle pain    ROS: Pertinent items noted in HPI and remainder of comprehensive ROS otherwise negative.  HOME MEDS: Current Outpatient Medications  Medication Sig Dispense Refill  . aspirin 81 MG tablet Take 81 mg by mouth daily.      Marland Kitchen  losartan-hydrochlorothiazide (HYZAAR) 100-12.5 MG tablet Take 1 tablet by mouth once daily 90 tablet 0  . metoprolol tartrate (LOPRESSOR) 25 MG tablet Take 1 tablet by mouth twice daily 180 tablet 3  . Multiple Vitamin (MULTIVITAMIN) tablet Take 1 tablet by mouth daily.      . simvastatin (ZOCOR) 20 MG tablet Take 1 tablet (20 mg total) by mouth at bedtime. 90 tablet 3   No current facility-administered medications for this visit.    LABS/IMAGING: No results found for this or any previous visit (from the past 48 hour(s)). No results found.  WEIGHTS: Wt Readings from Last 3 Encounters:  04/30/20 131 lb 9.6 oz (59.7 kg)  02/13/19 127 lb 6.4 oz (  57.8 kg)  03/06/18 131 lb 9.6 oz (59.7 kg)    VITALS: BP 118/62   Pulse 84   Ht 5\' 1"  (1.549 m)   Wt 131 lb 9.6 oz (59.7 kg)   SpO2 96%   BMI 24.87 kg/m   EXAM: General appearance: alert and no distress Neck: no carotid bruit, no JVD and thyroid not enlarged, symmetric, no tenderness/mass/nodules Lungs: clear to auscultation bilaterally Heart: regular rate and rhythm, S1, S2 normal, no murmur, click, rub or gallop Abdomen: soft, non-tender; bowel sounds normal; no masses,  no organomegaly Extremities: extremities normal, atraumatic, no cyanosis or edema Pulses: 2+ and symmetric Skin: Skin color, texture, turgor normal. No rashes or lesions Neurologic: Grossly normal Psych: Pleasant  EKG: Deferred  ASSESSMENT: 1. CAD status post four-vessel CABG (2009) 2. Dyslipidemia 3. Hypertension 4. Statin intolerant  PLAN: 1.   Mrs. Arlen continued Repatha earlier this year due to being tired of giving herself injections.  She was agreeable to go back on statin therapy which is reasonable provided it is tolerated.  She has been on simvastatin 20 with an LDL now of 90.  Would like to see if he can get a little lower.  I would recommend increasing her simvastatin up to 40 mg daily.  She is not on any calcium channel blockers or other  medications interfering with it based on FDA guidelines.  Repeat lipids in 3 to 4 months and follow-up with me annually or sooner as necessary.  11-13-1982, MD, [facility], FACP  [place]  [facility]  Medical Director of the [facility] Lip[facility]  Card[facility] Clinic Diplomate of the American Board of Clinical Lipidology Attending Cardiologist  Direct Dial: [phone]  Fax: [phone]  Website:  [email or web address] Caitrin Pendergraph 04/30/2020, 10:14 AM

## 2020-04-30 NOTE — Patient Instructions (Signed)
Medication Instructions:  Your physician has recommended you make the following change in your medication:  INCREASE simvastatin to 40mg  daily  *If you need a refill on your cardiac medications before your next appointment, please call your pharmacy*   Lab Work: FASTING lipid panel in 3 months  If you have labs (blood work) drawn today and your tests are completely normal, you will receive your results only by: MyChart Message (if you have MyChart) OR . A paper copy in the mail If you have any lab test that is abnormal or we need to change your treatment, we will call you to review the results.   Testing/Procedures: NONE   Follow-Up: At [facility], you and your health needs are our priority.  As part of our continuing mission to provide you with exceptional heart care, we have created designated Provider Care Teams.  These Care Teams include your primary Cardiologist (physician) and Advanced Practice Providers (APPs -  Physician Assistants and Nurse Practitioners) who all work together to provide you with the care you need, when you need it.  We recommend signing up for the patient portal called "MyChart".  Sign up information is provided on this After Visit Summary.  MyChart is used to connect with patients for Virtual Visits (Telemedicine).  Patients are able to view lab/test results, encounter notes, upcoming appointments, etc.  Non-urgent messages can be sent to your provider as well.   To learn more about what you can do with MyChart, go to [facility].    Your next appointment:   12 month(s)  The format for your next appointment:   In Person  Provider:   [email or web address] Hilty, MD   Other Instructions

## 2020-05-25 DIAGNOSIS — N958 Other specified menopausal and perimenopausal disorders: Secondary | ICD-10-CM | POA: Diagnosis not present

## 2020-05-25 DIAGNOSIS — Z01419 Encounter for gynecological examination (general) (routine) without abnormal findings: Secondary | ICD-10-CM | POA: Diagnosis not present

## 2020-05-25 DIAGNOSIS — M816 Localized osteoporosis [Lequesne]: Secondary | ICD-10-CM | POA: Diagnosis not present

## 2020-05-25 DIAGNOSIS — Z6824 Body mass index (BMI) 24.0-24.9, adult: Secondary | ICD-10-CM | POA: Diagnosis not present

## 2020-07-08 ENCOUNTER — Other Ambulatory Visit: Payer: Self-pay | Admitting: Internal Medicine

## 2020-09-06 IMAGING — MG DIGITAL SCREENING BILATERAL MAMMOGRAM WITH TOMO AND CAD
8 series · 8 of 24 positions shown · non-contrast
Comparison: Previous exam(s).

CLINICAL DATA: Screening.

EXAM:
DIGITAL SCREENING BILATERAL MAMMOGRAM WITH TOMO AND CAD

[R MLO synth-2D]
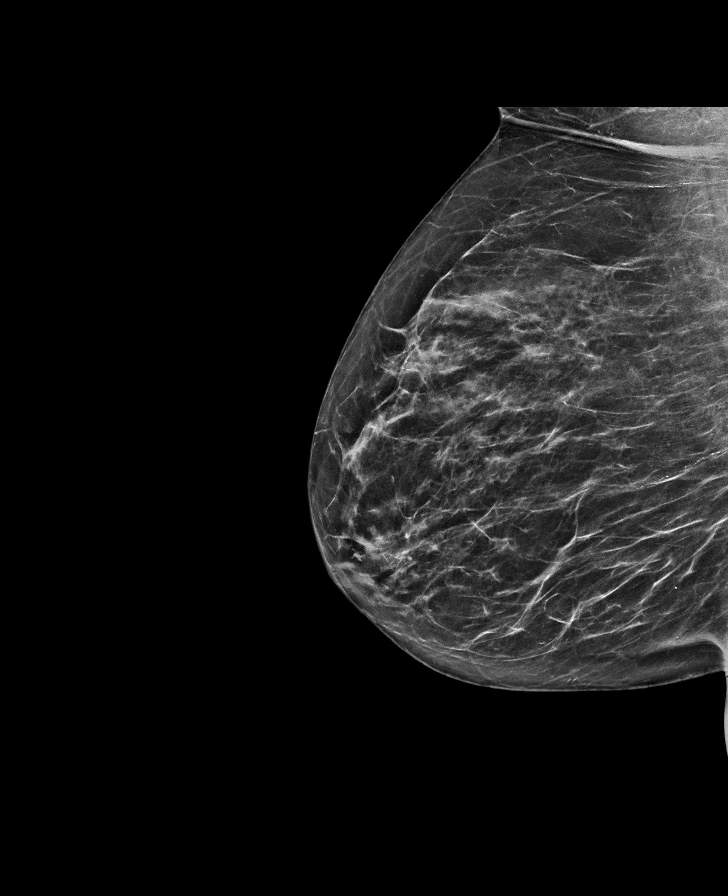

[L CC synth-2D]
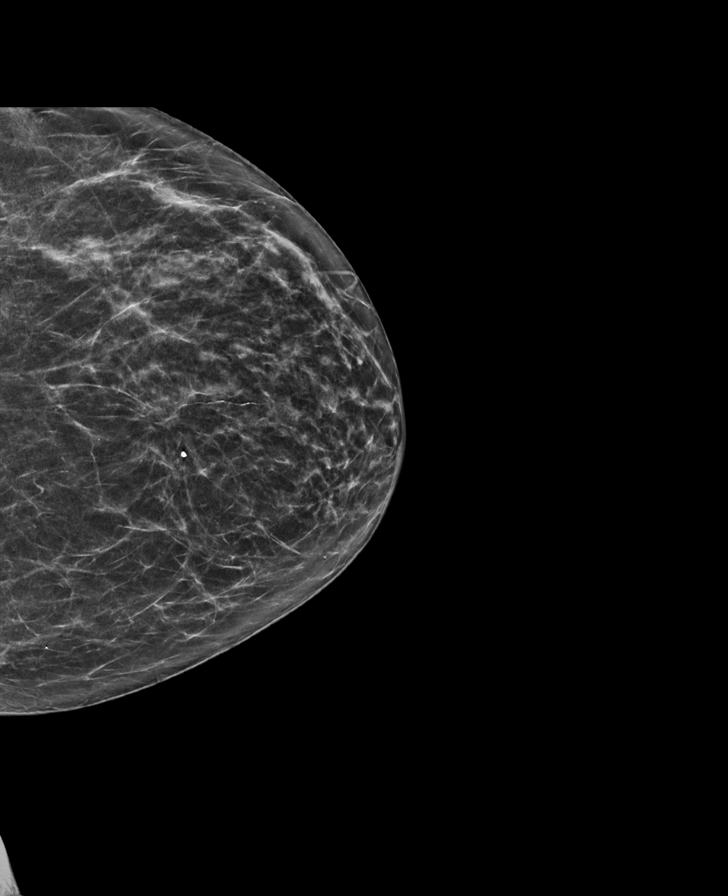

[L MLO synth-2D]
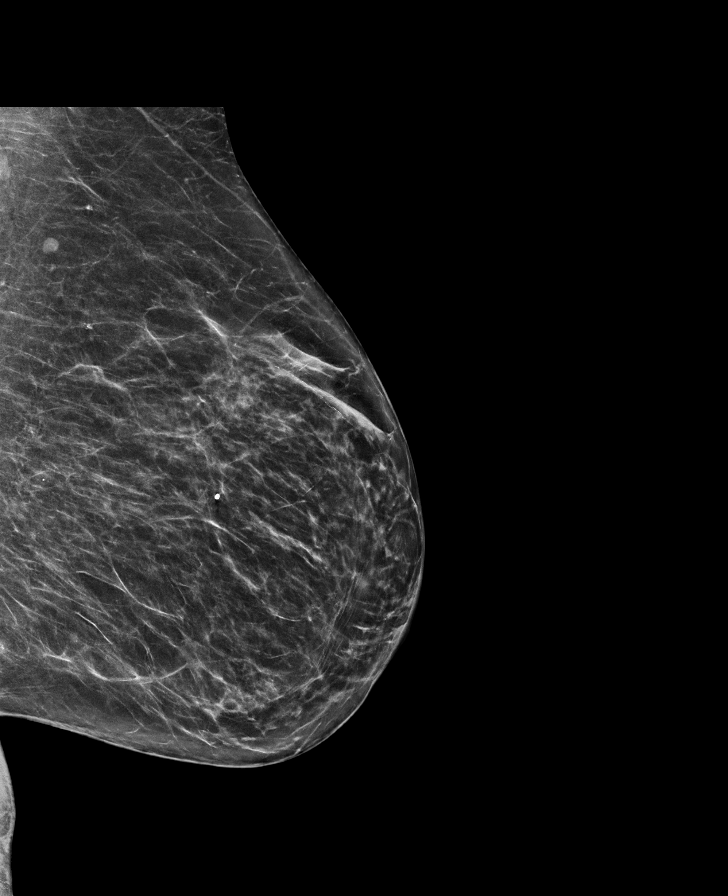

[R CC synth-2D]
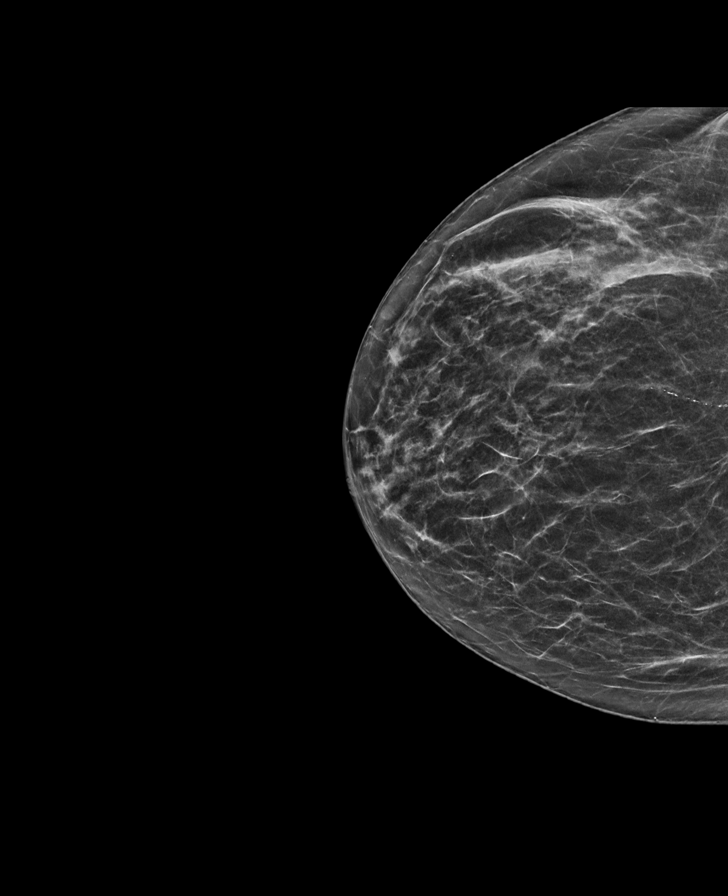

[L CC tomo · tomo slice 31/61.0]
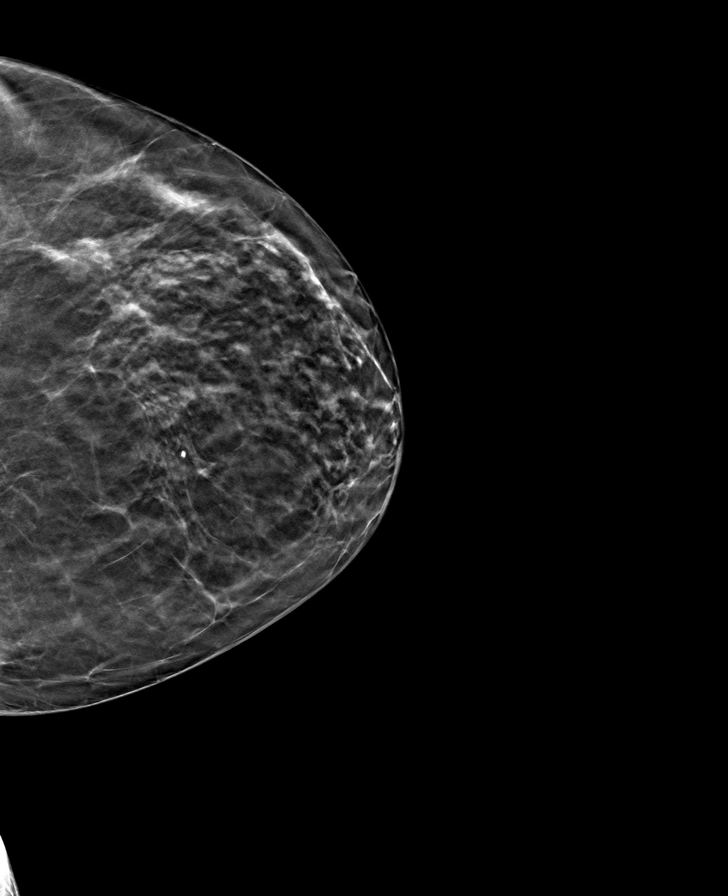

[R MLO tomo · tomo slice 33/65.0]
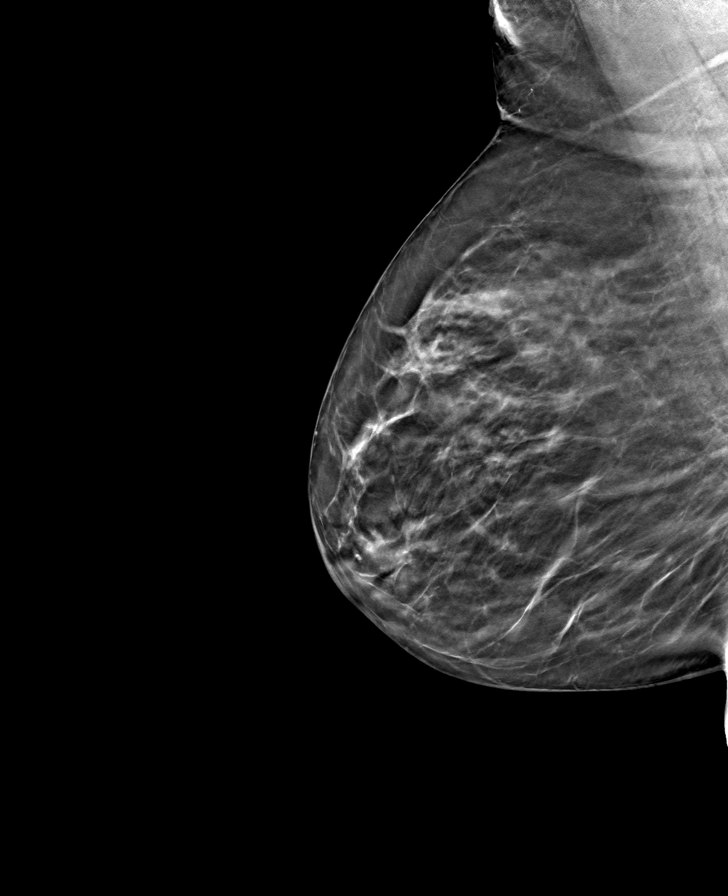

[L MLO tomo · tomo slice 33/66.0]
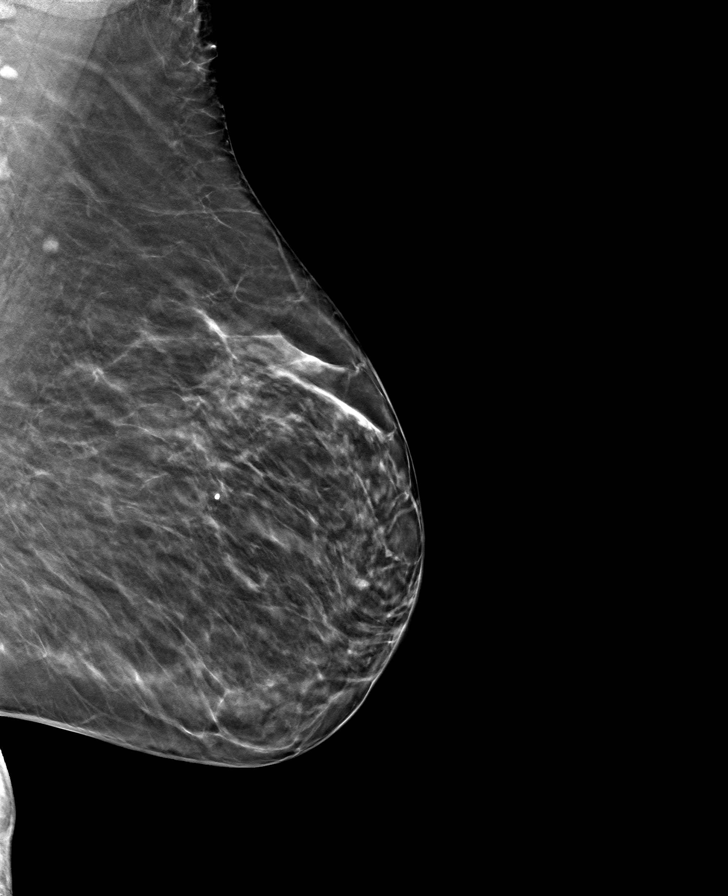

[R CC tomo · tomo slice 31/61.0]
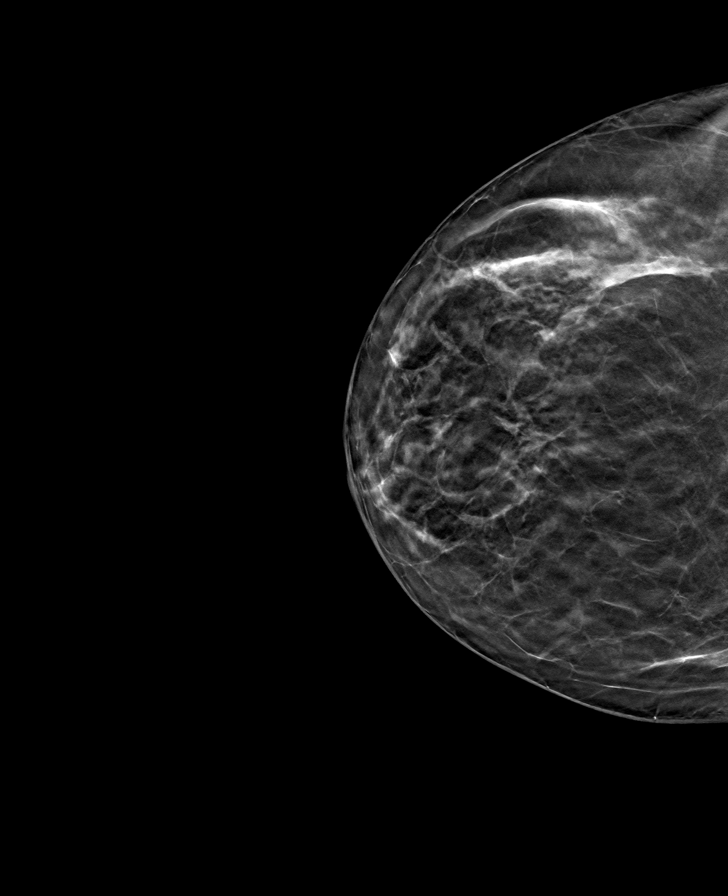

[8 of 24 positions shown; findings below may reference images not displayed]

ACR Breast Density Category b: There are scattered areas of
fibroglandular density.
FINDINGS: There are no findings suspicious for malignancy. Images were
processed with CAD.
IMPRESSION: No mammographic evidence of malignancy. A result letter of this
screening mammogram will be mailed directly to the patient.

RECOMMENDATION:
Screening mammogram in one year. (Code:[id number])

BI-RADS CATEGORY  1: Negative.

## 2020-10-05 ENCOUNTER — Other Ambulatory Visit: Payer: Self-pay | Admitting: Internal Medicine

## 2020-10-05 NOTE — Telephone Encounter (Signed)
Rx has been sent to the pharmacy electronically. ° °

## 2021-01-03 ENCOUNTER — Other Ambulatory Visit: Payer: Self-pay | Admitting: Internal Medicine

## 2021-01-05 DIAGNOSIS — K5900 Constipation, unspecified: Secondary | ICD-10-CM | POA: Diagnosis not present

## 2021-01-05 DIAGNOSIS — G8929 Other chronic pain: Secondary | ICD-10-CM | POA: Diagnosis not present

## 2021-01-05 DIAGNOSIS — R1031 Right lower quadrant pain: Secondary | ICD-10-CM | POA: Diagnosis not present

## 2021-01-06 ENCOUNTER — Other Ambulatory Visit: Payer: Self-pay | Admitting: Internal Medicine

## 2021-01-06 DIAGNOSIS — K5900 Constipation, unspecified: Secondary | ICD-10-CM

## 2021-01-06 DIAGNOSIS — R1031 Right lower quadrant pain: Secondary | ICD-10-CM

## 2021-01-07 DIAGNOSIS — G8929 Other chronic pain: Secondary | ICD-10-CM | POA: Diagnosis not present

## 2021-01-07 DIAGNOSIS — R1031 Right lower quadrant pain: Secondary | ICD-10-CM | POA: Diagnosis not present

## 2021-01-07 DIAGNOSIS — K5900 Constipation, unspecified: Secondary | ICD-10-CM | POA: Diagnosis not present

## 2021-01-21 ENCOUNTER — Other Ambulatory Visit: Payer: Self-pay

## 2021-01-21 ENCOUNTER — Ambulatory Visit
Admission: RE | Admit: 2021-01-21 | Discharge: 2021-01-21 | Disposition: A | Discharge status: Home / Self Care | Payer: Medicare HMO | Source: Ambulatory Visit | Attending: Internal Medicine | Admitting: Internal Medicine

## 2021-01-21 DIAGNOSIS — I700 Atherosclerosis of aorta: Secondary | ICD-10-CM | POA: Diagnosis not present

## 2021-01-21 DIAGNOSIS — K5730 Diverticulosis of large intestine without perforation or abscess without bleeding: Secondary | ICD-10-CM | POA: Diagnosis not present

## 2021-01-21 DIAGNOSIS — K5900 Constipation, unspecified: Secondary | ICD-10-CM

## 2021-01-21 DIAGNOSIS — R1031 Right lower quadrant pain: Secondary | ICD-10-CM

## 2021-01-21 DIAGNOSIS — J984 Other disorders of lung: Secondary | ICD-10-CM | POA: Diagnosis not present

## 2021-01-21 MED ORDER — IOPAMIDOL (ISOVUE-300) INJECTION 61%
100.0000 mL | Freq: Once | INTRAVENOUS | Status: AC | PRN
  Administered 2021-01-21: 100 mL via INTRAVENOUS

## 2021-01-28 ENCOUNTER — Ambulatory Visit
Admission: RE | Admit: 2021-01-28 | Discharge: 2021-01-28 | Disposition: A | Discharge status: Home / Self Care | Payer: Medicare HMO | Source: Ambulatory Visit | Attending: Home Modifications | Admitting: Home Modifications

## 2021-01-28 ENCOUNTER — Other Ambulatory Visit: Payer: Self-pay | Admitting: Home Modifications

## 2021-01-28 DIAGNOSIS — S76011A Strain of muscle, fascia and tendon of right hip, initial encounter: Secondary | ICD-10-CM | POA: Diagnosis not present

## 2021-01-28 DIAGNOSIS — R52 Pain, unspecified: Secondary | ICD-10-CM

## 2021-01-28 DIAGNOSIS — M25551 Pain in right hip: Secondary | ICD-10-CM | POA: Diagnosis not present

## 2021-01-28 DIAGNOSIS — Z6824 Body mass index (BMI) 24.0-24.9, adult: Secondary | ICD-10-CM | POA: Diagnosis not present

## 2021-01-28 DIAGNOSIS — M1611 Unilateral primary osteoarthritis, right hip: Secondary | ICD-10-CM | POA: Diagnosis not present

## 2021-01-28 DIAGNOSIS — M25561 Pain in right knee: Secondary | ICD-10-CM | POA: Diagnosis not present

## 2021-03-19 ENCOUNTER — Other Ambulatory Visit: Payer: Self-pay | Admitting: Obstetrics and Gynecology

## 2021-03-19 DIAGNOSIS — Z1231 Encounter for screening mammogram for malignant neoplasm of breast: Secondary | ICD-10-CM

## 2021-04-09 ENCOUNTER — Other Ambulatory Visit: Payer: Self-pay | Admitting: Internal Medicine

## 2021-04-19 ENCOUNTER — Other Ambulatory Visit: Payer: Self-pay

## 2021-04-19 ENCOUNTER — Ambulatory Visit
Admission: RE | Admit: 2021-04-19 | Discharge: 2021-04-19 | Disposition: A | Discharge status: Home / Self Care | Payer: Medicare HMO | Source: Ambulatory Visit | Attending: Obstetrics and Gynecology | Admitting: Obstetrics and Gynecology

## 2021-04-19 DIAGNOSIS — Z1231 Encounter for screening mammogram for malignant neoplasm of breast: Secondary | ICD-10-CM | POA: Diagnosis not present

## 2021-06-01 ENCOUNTER — Other Ambulatory Visit: Payer: Self-pay | Admitting: Internal Medicine

## 2021-06-09 DIAGNOSIS — Z6825 Body mass index (BMI) 25.0-25.9, adult: Secondary | ICD-10-CM | POA: Diagnosis not present

## 2021-06-09 DIAGNOSIS — Z01419 Encounter for gynecological examination (general) (routine) without abnormal findings: Secondary | ICD-10-CM | POA: Diagnosis not present

## 2021-06-09 DIAGNOSIS — M5431 Sciatica, right side: Secondary | ICD-10-CM | POA: Diagnosis not present

## 2021-07-07 ENCOUNTER — Other Ambulatory Visit: Payer: Self-pay | Admitting: Internal Medicine

## 2021-07-21 ENCOUNTER — Other Ambulatory Visit: Payer: Self-pay | Admitting: Internal Medicine

## 2021-07-21 DIAGNOSIS — E785 Hyperlipidemia, unspecified: Secondary | ICD-10-CM

## 2021-07-22 DIAGNOSIS — E785 Hyperlipidemia, unspecified: Secondary | ICD-10-CM | POA: Diagnosis not present

## 2021-07-23 LAB — LIPID PANEL
Chol/HDL Ratio: 2.6 ratio (ref 0.0–4.4)
Cholesterol, Total: 142 mg/dL (ref 100–199)
HDL: 54 mg/dL (ref >39)
LDL Chol Calc (NIH): 70 mg/dL (ref 0–99)
Triglycerides: 100 mg/dL (ref 0–149)
VLDL Cholesterol Cal: 18 mg/dL (ref 5–40)

## 2021-07-27 ENCOUNTER — Ambulatory Visit (HOSPITAL_BASED_OUTPATIENT_CLINIC_OR_DEPARTMENT_OTHER): Payer: Medicare HMO | Admitting: Internal Medicine

## 2021-07-27 ENCOUNTER — Other Ambulatory Visit: Payer: Self-pay

## 2021-07-27 ENCOUNTER — Encounter (HOSPITAL_BASED_OUTPATIENT_CLINIC_OR_DEPARTMENT_OTHER): Payer: Self-pay | Admitting: Internal Medicine

## 2021-07-27 VITALS — BP 110/58 | HR 81 | Ht 61.0 in | Wt 129.8 lb

## 2021-07-27 DIAGNOSIS — E785 Hyperlipidemia, unspecified: Secondary | ICD-10-CM | POA: Diagnosis not present

## 2021-07-27 DIAGNOSIS — M791 Myalgia, unspecified site: Secondary | ICD-10-CM

## 2021-07-27 DIAGNOSIS — T466X5D Adverse effect of antihyperlipidemic and antiarteriosclerotic drugs, subsequent encounter: Secondary | ICD-10-CM | POA: Diagnosis not present

## 2021-07-27 DIAGNOSIS — R490 Dysphonia: Secondary | ICD-10-CM | POA: Diagnosis not present

## 2021-07-27 DIAGNOSIS — Z951 Presence of aortocoronary bypass graft: Secondary | ICD-10-CM

## 2021-07-27 NOTE — Progress Notes (Signed)
OFFICE NOTE  Chief Complaint:  Follow-up dyslipidemia and coronary disease  Primary Care Physician: Hulan Fess, MD  HPI:  [name] is a 87 y.o. female who was a former patient of Dr. Mare Ferrari. She is followed for a history of hypertension and dyslipidemia. She also has a history of coronary artery disease and is status post coronary artery bypass grafting in 2009 x 4 by Dr. Cyndia Bent (LIMA to LAD, sequential SVG to the first diagonal and second and third obtuse marginal branches of the left circumflex, and SVG to the posterior descending branch of the right coronary). She is generally been asymptomatic since that time. She has not undergone any routine stress testing or echocardiography. Blood pressures been well controlled. Cholesterol is at goal on low-dose Zocor.  09/30/2016  [name] was seen today in follow-up. She seems to be doing well without any new complaints. Blood pressure was excellent today 132/70. She says that she's had intolerance of statins in the past. She reports myalgias with Lipitor and to a lesser extent Crestor and she developed some hoarseness in her voice with simvastatin. She has been able to tolerate 10 mg a simvastatin. We recently repeated a lipid profile showed total cholesterol 172, HDL 62, LDL 88 and triglycerides 110. I initially advised her to increase for simvastatin however she is little hesitant to do that. We had a nice discussion about that in the office today. Apparently she's been on Zetia in the past with even worse symptoms of muscle pain than with her Lipitor. She's also previously tried fish oil and niacin. We discussed the possibility of asymptomatic stress testing because of her bypass grafts being quite old at this point however she declined at this time.  11/10/2017  [name] was seen today in follow-up.  She is done well over the past year.  She is now 10 years out from coronary artery bypass grafting in 2009.  She denies chest pain  or worsening shortness of breath.  She has not had interval assessment of her bypass grafts with stress testing.  She had a well-controlled lipid profile on simvastatin, however discontinued this due to side effects including vocal hoarseness.  She was switched to rosuvastatin which time she developed leg pain both on the 5 and 10 mg doses.  She is previously been on ezetimibe and developed muscle pain.  Based on this she is deemed statin intolerant.  She does have ASCVD with four-vessel bypass in 2009 and has a goal LDL less than 70.  She is not likely to achieve that goal without additional therapy.  Unfortunately, she just had a repeat lipid profile which showed total cholesterol 231, triglycerides 131, HDL 57 and LDL 148 (off of therapy).  Based on this, I think she is a good candidate for PCSK9 inhibitor therapy.  We discussed this today and will pursue Repatha.  03/06/2018  [name] is seen today in follow-up.  She was felt to be a good candidate for PCSK9 and therapy and has started on Repatha.  I am pleased to report marked improvement in her dyslipidemia.  Her total cholesterol is decreased from 2 31-1 49.  Triglycerides have decreased from 131-1 14, HDL is increased from 54-57 and LDL cholesterol has decreased from 148-72.  She is tolerating the medication without any significant side effects.  This puts her slightly above goal LDL less than 70.  02/13/2019  [name] returns today for follow-up.  She denies any more chest pain or  worsening shortness of breath.  I have been working with her on coronary disease as well as dyslipidemia and had recommended PCSK9 inhibitor therapy.  Fortunately she had significant reduction in LDL cholesterol down to 72 on treatment and had been doing well without side effects.  She returns now 6 months later.  Overall she feels well.  Her blood pressures been well controlled.  We had discussed with the addition of HCTZ that ultimately we may combine her losartan  with the HCTZ for ease of administration.  She is due for repeat lipids.  04/30/2020  [name] seen today in follow-up.  Overall she is doing well from a cardiac standpoint.  She denies any chest pain or worsening shortness of breath.  Unfortunately she recently stopped using the PCSK9 hitters.  She said she was "tired of giving herself injections".  She was agreeable to go back on a statin medication which she said in the past that caused her a hoarse voice, namely simvastatin.  I believe she was on 40 mg previously but we started 20 mg daily.  She has been on that for several months since May 2021 and repeat lipids in August showed total cholesterol 164, HDL 56, LDL 90 and triglycerides 100.  Overall pretty good control however her target is less than 70 for LDL.  07/27/2021  [name] returns today for follow-up.  Overall she is doing well.  Her cholesterol is further improved, now demonstrating total cholesterol 142, triglycerides 100, HDL 54 and LDL 70, improved from 90 previously.  Although she is doing well with her lipids, she has been a little concerned about some abnormalities in her voice.  She feels that the higher dose of simvastatin has been associated with vocal hoarseness.  In the past she says she has had issues with this but seems to improve with lower doses.  This is unusual and have not seen it with other patients.  I think it would be helpful though to get an ENT opinion about it.  PMHx:  Past Medical History:  Diagnosis Date   Cataract    Coronary artery disease    Hypercholesterolemia    Hypertension    ESSENTIAL   Myalgia    Nausea     Past Surgical History:  Procedure Laterality Date   CORONARY ARTERY BYPASS GRAFT      FAMHx:  Family History  Problem Relation Age of Onset   Stroke Mother 22   Cancer Father        lung cancer   Uterine cancer Sister 33   Dementia Sister    Dementia Sister    Hypertension Sister    Stroke Sister 36   Heart disease Sister  73    SOCHx:   reports that she has never smoked. She has never used smokeless tobacco. She reports that she does not drink alcohol and does not use drugs.  ALLERGIES:  Allergies  Allergen Reactions   Amiodarone     rash   Crestor [Rosuvastatin Calcium]     Leg pain   Ramipril     cough   Simvastatin     Week hourse voice   Sulfa Antibiotics     Other reaction(s): stomach upset   Zetia [Ezetimibe] Other (See Comments)    Muscle pain    ROS: Pertinent items noted in HPI and remainder of comprehensive ROS otherwise negative.  HOME MEDS: Current Outpatient Medications  Medication Sig Dispense Refill   aspirin 81 MG tablet Take 81  mg by mouth daily.       Coenzyme Q10 (CO Q 10) 60 MG CAPS See admin instructions.     losartan-hydrochlorothiazide (HYZAAR) 100-12.5 MG tablet Take 1 tablet by mouth once daily 90 tablet 0   metoprolol tartrate (LOPRESSOR) 25 MG tablet Take 1 tablet by mouth twice daily 180 tablet 0   Multiple Vitamin (MULTIVITAMIN) tablet Take 1 tablet by mouth daily.       Omega 3 1200 MG CAPS 1 capsule     simvastatin (ZOCOR) 40 MG tablet TAKE 1 TABLET BY MOUTH AT BEDTIME 90 tablet 3   No current facility-administered medications for this visit.    LABS/IMAGING: No results found for this or any previous visit (from the past 48 hour(s)). No results found.  WEIGHTS: Wt Readings from Last 3 Encounters:  07/27/21 129 lb 12.8 oz (58.9 kg)  04/30/20 131 lb 9.6 oz (59.7 kg)  02/13/19 127 lb 6.4 oz (57.8 kg)    VITALS: BP (!) 110/58    Pulse 81    Ht 5\' 1"  (1.549 m)    Wt 129 lb 12.8 oz (58.9 kg)    SpO2 97%    BMI 24.53 kg/m   EXAM: Deferred  EKG: Deferred  ASSESSMENT: CAD status post four-vessel CABG (2009) Dyslipidemia Hypertension Statin intolerant  PLAN: 1.   [name] has tolerated increased doses of simvastatin and now is at target LDL of 70, however she has complained of some vocal hoarseness related to simvastatin.  This is not listed  as a side effect of the medication and I am not familiar with it in my experience with thousands of other patients.  I would like however to do make sure there is not some other reason for her vocal hoarseness and refer her to ENT for further evaluation.  Ultimately would be up to her as to whether or not she can tolerate this as her cholesterol is actually well controlled.  Pixie Casino, MD, [facility], [place] Director of the Advanced Lipid Disorders &  Cardiovascular Risk Reduction Clinic Diplomate of the American Board of Clinical Lipidology Attending Cardiologist  Direct Dial: [phone]   Fax: [phone]  Website:  [email or web address]  Pixie Casino 07/27/2021, 2:31 PM

## 2021-07-27 NOTE — Patient Instructions (Signed)
Medication Instructions:  No Changes In Medications at this time.  *If you need a refill on your cardiac medications before your next appointment, please call your pharmacy*  Follow-Up: At [facility], you and your health needs are our priority.  As part of our continuing mission to provide you with exceptional heart care, we have created designated Provider Care Teams.  These Care Teams include your primary Cardiologist (physician) and Advanced Practice Providers (APPs -  Physician Assistants and Nurse Practitioners) who all work together to provide you with the care you need, when you need it.  We recommend signing up for the patient portal called "MyChart".  Sign up information is provided on this After Visit Summary.  MyChart is used to connect with patients for Virtual Visits (Telemedicine).  Patients are able to view lab/test results, encounter notes, upcoming appointments, etc.  Non-urgent messages can be sent to your provider as well.   To learn more about what you can do with MyChart, go to [email or web address].    Your next appointment:   1 year(s)  The format for your next appointment:   In Person  Provider: Dr. Margette Fast TO ENT Dr. Suszanne Conners- SOMEONE WILL REACH OUT TO YOU TO SCHEDULE THIS

## 2021-08-09 DIAGNOSIS — M5451 Vertebrogenic low back pain: Secondary | ICD-10-CM | POA: Diagnosis not present

## 2021-08-09 DIAGNOSIS — Z981 Arthrodesis status: Secondary | ICD-10-CM | POA: Diagnosis not present

## 2021-08-09 DIAGNOSIS — M1611 Unilateral primary osteoarthritis, right hip: Secondary | ICD-10-CM | POA: Diagnosis not present

## 2021-08-17 ENCOUNTER — Telehealth: Payer: Self-pay | Admitting: Internal Medicine

## 2021-08-17 DIAGNOSIS — R490 Dysphonia: Secondary | ICD-10-CM

## 2021-08-17 NOTE — Telephone Encounter (Signed)
Left message for patient to call back  

## 2021-08-17 NOTE — Telephone Encounter (Signed)
Office of [facility] contacted patient regarding ENT referral. She states they informed her that they are no longer seeing patients for throat issues? Patient requesting a call back to discuss.

## 2021-08-18 NOTE — Telephone Encounter (Signed)
Returned call to patient. Patient states that she called the ENT office that she was referred to and they told her that they are no longer accepting patients with throat problems ? ? ?Also wanted to mention that she had to go to another doctor that told her she will need to have a hip replacement. Advised her she will need to have a formal preoperative clearance sent to our office! Patient is going to see the potential surgeon on 3/7  ?

## 2021-08-19 NOTE — Telephone Encounter (Signed)
New ENT ref placed per Dr. Rennis Golden!  ?

## 2021-08-19 NOTE — Addendum Note (Signed)
Addended by: Marlene Lard on: 08/19/2021 09:03 AM ? ? Modules accepted: Orders ? ?

## 2021-08-24 ENCOUNTER — Telehealth: Payer: Self-pay | Admitting: *Deleted

## 2021-08-24 ENCOUNTER — Telehealth: Payer: Self-pay

## 2021-08-24 DIAGNOSIS — M1611 Unilateral primary osteoarthritis, right hip: Secondary | ICD-10-CM | POA: Diagnosis not present

## 2021-08-24 DIAGNOSIS — M25551 Pain in right hip: Secondary | ICD-10-CM | POA: Diagnosis not present

## 2021-08-24 NOTE — Telephone Encounter (Signed)
Primary Cardiologist:Kenneth Alona Bene, MD ? ?Chart reviewed as part of pre-operative protocol coverage. Because of [name] past medical history and time since last visit, she will require a telephone call in order to better assess preoperative cardiovascular risk. ? ?Pre-op covering staff: ?- Please contact patient, obtain consent and schedule appointment.  ?- Please contact requesting surgeon's office via preferred method (i.e, phone, fax) to inform them of need for appointment prior to surgery. ? ?Levi Aland, NP-C ? ?  ?08/24/2021, 3:44 PM ?[facility] ?[address], [address] ?Office [phone] Fax [phone] ? ?

## 2021-08-24 NOTE — Telephone Encounter (Signed)
Pt agreeable to plan of care for tele visit with Christen Bame, NP for pre op clearance. Pt aware call is 08/25/21 @ 3 pm. Med rec and consent have been done.  ?

## 2021-08-24 NOTE — Telephone Encounter (Signed)
? ?  Pre-operative Risk Assessment  ?  ?Patient Name: [name]  ?DOB: Feb 16, 1935 ?MRN: [id number]  ? ?  ? ?Request for Surgical Clearance   ? ?Procedure:   Right Total Hip Arthroplasty  ? ?Date of Surgery:  Clearance TBD                              ?   ?Surgeon:  Dr. Linna Caprice  ?Surgeon's Group or Practice Name:  [facility] ?Phone number:  [phone] Attn: Jamison Oka ?Fax number:  [phone] ?  ?Type of Clearance Requested:   ?- Medical  ?  ?Type of Anesthesia:  Spinal ?  ?Additional requests/questions:   ? ?Signed, ?Abel Hageman L Colton Tassin   ?08/24/2021, 3:24 PM   ?

## 2021-08-24 NOTE — Telephone Encounter (Signed)
?  Patient Consent for Virtual Visit  ? ? ? ?   ? ?[name] has provided verbal consent on 08/24/2021 for a virtual visit (video or telephone). ? ? ?CONSENT FOR VIRTUAL VISIT FOR:  [name]  ?By participating in this virtual visit I agree to the following: ? ?I hereby voluntarily request, consent and authorize [facility] and its employed or contracted physicians, physician assistants, nurse practitioners or other licensed health care professionals (the Practitioner), to provide me with telemedicine health care services (the ?Services") as deemed necessary by the treating Practitioner. I acknowledge and consent to receive the Services by the Practitioner via telemedicine. I understand that the telemedicine visit will involve communicating with the Practitioner through live audiovisual communication technology and the disclosure of certain medical information by electronic transmission. I acknowledge that I have been given the opportunity to request an in-person assessment or other available alternative prior to the telemedicine visit and am voluntarily participating in the telemedicine visit. ? ?I understand that I have the right to withhold or withdraw my consent to the use of telemedicine in the course of my care at any time, without affecting my right to future care or treatment, and that the Practitioner or I may terminate the telemedicine visit at any time. I understand that I have the right to inspect all information obtained and/or recorded in the course of the telemedicine visit and may receive copies of available information for a reasonable fee.  I understand that some of the potential risks of receiving the Services via telemedicine include:  ?Delay or interruption in medical evaluation due to technological equipment failure or disruption; ?Information transmitted may not be sufficient (e.g. poor resolution of images) to allow for appropriate medical decision making by the Practitioner;  and/or  ?In rare instances, security protocols could fail, causing a breach of personal health information. ? ?Furthermore, I acknowledge that it is my responsibility to provide information about my medical history, conditions and care that is complete and accurate to the best of my ability. I acknowledge that Practitioner's advice, recommendations, and/or decision may be based on factors not within their control, such as incomplete or inaccurate data provided by me or distortions of diagnostic images or specimens that may result from electronic transmissions. I understand that the practice of medicine is not an exact science and that Practitioner makes no warranties or guarantees regarding treatment outcomes. I acknowledge that a copy of this consent can be made available to me via my patient portal [facility] MyChart), or I can request a printed copy by calling the office of [facility].   ? ?I understand that my insurance will be billed for this visit.  ? ?I have read or had this consent read to me. ?I understand the contents of this consent, which adequately explains the benefits and risks of the Services being provided via telemedicine.  ?I have been provided ample opportunity to ask questions regarding this consent and the Services and have had my questions answered to my satisfaction. ?I give my informed consent for the services to be provided through the use of telemedicine in my medical care ? ? ? ? ?

## 2021-08-25 ENCOUNTER — Encounter: Payer: Self-pay | Admitting: Nurse Practitioner

## 2021-08-25 ENCOUNTER — Ambulatory Visit (INDEPENDENT_AMBULATORY_CARE_PROVIDER_SITE_OTHER): Payer: Medicare HMO | Admitting: Nurse Practitioner

## 2021-08-25 ENCOUNTER — Other Ambulatory Visit: Payer: Self-pay

## 2021-08-25 DIAGNOSIS — Z01810 Encounter for preprocedural cardiovascular examination: Secondary | ICD-10-CM

## 2021-08-25 NOTE — Progress Notes (Signed)
? ?Virtual Visit via Telephone Note  ? ?This visit type was conducted due to national recommendations for restrictions regarding the COVID-19 Pandemic (e.g. social distancing) in an effort to limit this patient's exposure and mitigate transmission in our community.  Due to her co-morbid illnesses, this patient is at least at moderate risk for complications without adequate follow up.  This format is felt to be most appropriate for this patient at this time.  The patient did not have access to video technology/had technical difficulties with video requiring transitioning to audio format only (telephone).  All issues noted in this document were discussed and addressed.  No physical exam could be performed with this format.  Please refer to the patient's chart for her  consent to telehealth for [facility] ?Evaluation Performed:  Preoperative cardiovascular risk assessment ? ?This visit type was conducted due to national recommendations for restrictions regarding the COVID-19 Pandemic (e.g. social distancing).  This format is felt to be most appropriate for this patient at this time.  All issues noted in this document were discussed and addressed.  No physical exam was performed (except for noted visual exam findings with Video Visits).  Please refer to the patient's chart (MyChart message for video visits and phone note for telephone visits) for the patient's consent to telehealth for [facility]. ?_____________  ? ?Date:  08/25/2021  ? ?Patient ID:  [name], DOB 23-Sep-1934, MRN [id number] ?Patient Location:  ?Home ?Provider location:   ?Office ? ?Primary Care Provider:  Hulan Fess, MD ?Primary Cardiologist:  Pixie Casino, MD ? ?Chief Complaint  ?  ?87 y.o. y/o female with a h/o hypertension, dyslipidemia, CAD s/p CABG x 4 in 2009 who is pending Right Total Hip Arthroplasty, and presents today for telephonic preoperative cardiovascular risk assessment. ? ?Past Medical History  ?  ?Past Medical  History:  ?Diagnosis Date  ? Cataract   ? Coronary artery disease   ? Hypercholesterolemia   ? Hypertension   ? ESSENTIAL  ? Myalgia   ? Nausea   ? ?Past Surgical History:  ?Procedure Laterality Date  ? CORONARY ARTERY BYPASS GRAFT    ? ? ?Allergies ? ?Allergies  ?Allergen Reactions  ? Amiodarone   ?  rash  ? Crestor [Rosuvastatin Calcium]   ?  Leg pain  ? Ramipril   ?  cough  ? Simvastatin   ?  Week hourse voice  ? Sulfa Antibiotics   ?  Other reaction(s): stomach upset  ? Zetia [Ezetimibe] Other (See Comments)  ?  Muscle pain  ? ? ?History of Present Illness  ?  ?[name] is a 87 y.o. female who presents via audio/video conferencing for a telehealth visit today.  Pt was last seen in cardiology clinic on 07/27/21, by Dr. Debara Pickett.  At that time [name] was doing well.  She is now pending right hip replacement.  Since his last visit, she has felt well. She denies chest discomfort, dyspnea, or any symptom concerning for angina. She is limited physically by hip pain that worsens when walking. She has very occasional lightheadedness, no presynope, no syncope. She denies edema. No bleeding concerns.  ? ?Home Medications  ?  ?Prior to Admission medications   ?Medication Sig Start Date End Date Taking? Authorizing Provider  ?aspirin 81 MG tablet Take 81 mg by mouth daily.      [provider]  ?Coenzyme Q10 (CO Q 10) 60 MG CAPS See admin instructions.    [provider]  ?losartan-hydrochlorothiazide (HYZAAR) 100-12.5 MG tablet Take 1 tablet by mouth once daily 07/08/21   Hilty, Nadean Corwin, MD  ?metoprolol tartrate (LOPRESSOR) 25 MG tablet Take 1 tablet by mouth twice daily 07/08/21   Hilty, Nadean Corwin, MD  ?Multiple Vitamin (MULTIVITAMIN) tablet Take 1 tablet by mouth daily.      [provider]  ?Omega 3 1200 MG CAPS 1 capsule    [provider]  ?simvastatin (ZOCOR) 40 MG tablet TAKE 1 TABLET BY MOUTH AT BEDTIME 06/02/21   Hilty, Nadean Corwin, MD  ? ? ?Physical Exam  ?  ?Vital  Signs:  [name] does not have vital signs available for review today. ? ?Given telephonic nature of communication, physical exam is limited. ?AAOx3. NAD. Normal affect.  Speech and respirations are unlabored. ? ?Accessory Clinical Findings  ?  ?None ? ?Assessment & Plan  ?  ?1.  Preoperative Cardiovascular Risk Assessment: Patient reports she is doing well and may proceed with surgery without further cardiac testing. It is our preference that she continue aspirin through the perioperative period, however surgeon may use judgment in order to ensure hemodynamic stability. According to the Revised Cardiac Risk Index (RCRI), her Perioperative Risk of Major Cardiac Event is (%): 0.9. Her Functional Capacity in METs is: 4.64 according to the Duke Activity Status Index (DASI). ? ? ?COVID-19 Education: ?The signs and symptoms of COVID-19 were discussed with the patient and how to seek care for testing (follow up with PCP or arrange E-visit).  The importance of social distancing was discussed today. ? ?Patient Risk:   ?After full review of this patient's history and clinical status, I feel that he is at least moderate risk for cardiac complications at this time, thus necessitating a telehealth visit sooner than our first available in office visit. ? ?Time:   ?Today, I have spent 10 minutes with the patient with telehealth technology discussing medical history, symptoms, and management plan.   ? ? ?[name], NP-C ? ?  ?08/25/2021, 2:43 PM ?[place] ?[id number], [address] ?Office [phone] Fax [phone] ? ? ?

## 2021-08-30 DIAGNOSIS — Z01818 Encounter for other preprocedural examination: Secondary | ICD-10-CM | POA: Diagnosis not present

## 2021-08-30 DIAGNOSIS — M1612 Unilateral primary osteoarthritis, left hip: Secondary | ICD-10-CM | POA: Diagnosis not present

## 2021-08-30 DIAGNOSIS — R7309 Other abnormal glucose: Secondary | ICD-10-CM | POA: Diagnosis not present

## 2021-09-06 ENCOUNTER — Ambulatory Visit: Payer: Self-pay | Admitting: Student

## 2021-09-06 NOTE — Progress Notes (Signed)
Sent message, via epic in basket, requesting orders in epic from surgeon.  

## 2021-09-14 ENCOUNTER — Ambulatory Visit: Payer: Self-pay | Admitting: Student

## 2021-09-14 NOTE — H&P (View-Only) (Signed)
TOTAL HIP ADMISSION H&P ? ?Patient is admitted for right total hip arthroplasty. ? ?Subjective: ? ?Chief Complaint: right hip pain ? ?HPI: [name], 87 y.o. female, has a history of pain and functional disability in the right hip(s) due to arthritis and patient has failed non-surgical conservative treatments for greater than 12 weeks to include NSAID's and/or analgesics, flexibility and strengthening excercises, supervised PT with diminished ADL's post treatment, use of assistive devices, and activity modification.  Onset of symptoms was gradual starting 10 years ago with rapidlly worsening course since that time.The patient noted no past surgery on the right hip(s).  Patient currently rates pain in the right hip at 10 out of 10 with activity. Patient has night pain, worsening of pain with activity and weight bearing, pain that interfers with activities of daily living, and pain with passive range of motion. Patient has evidence of subchondral cysts, subchondral sclerosis, periarticular osteophytes, and joint space narrowing by imaging studies. This condition presents safety issues increasing the risk of falls. There is no current active infection. ? ?Patient Active Problem List  ? Diagnosis Date Noted  ? Statin intolerance 11/10/2017  ? Hyperlipidemia LDL goal <70 01/04/2016  ? Ischemic heart disease 01/10/2014  ? Benign hypertensive heart disease without heart failure 07/14/2011  ? Hx of CABG 07/14/2011  ? Essential hypertension   ? Hypercholesterolemia   ? Coronary artery disease   ? Nausea   ? Myalgia   ? ?Past Medical History:  ?Diagnosis Date  ? Cataract   ? Coronary artery disease   ? Hypercholesterolemia   ? Hypertension   ? ESSENTIAL  ? Myalgia   ? Nausea   ?  ?Past Surgical History:  ?Procedure Laterality Date  ? CORONARY ARTERY BYPASS GRAFT    ?  ?Current Outpatient Medications  ?Medication Sig Dispense Refill Last Dose  ? aspirin 81 MG tablet Take 81 mg by mouth daily.       ? Coenzyme Q10 (CO Q  10) 60 MG CAPS See admin instructions.     ? losartan-hydrochlorothiazide (HYZAAR) 100-12.5 MG tablet Take 1 tablet by mouth once daily 90 tablet 0   ? metoprolol tartrate (LOPRESSOR) 25 MG tablet Take 1 tablet by mouth twice daily 180 tablet 0   ? Multiple Vitamin (MULTIVITAMIN) tablet Take 1 tablet by mouth daily.       ? Omega 3 1200 MG CAPS 1 capsule     ? simvastatin (ZOCOR) 40 MG tablet TAKE 1 TABLET BY MOUTH AT BEDTIME 90 tablet 3   ? ?No current facility-administered medications for this visit.  ? ?Allergies  ?Allergen Reactions  ? Amiodarone   ?  rash  ? Crestor [Rosuvastatin Calcium]   ?  Leg pain  ? Ramipril   ?  cough  ? Simvastatin   ?  Week hourse voice  ? Sulfa Antibiotics   ?  Other reaction(s): stomach upset  ? Zetia [Ezetimibe] Other (See Comments)  ?  Muscle pain  ?  ?Social History  ? ?Tobacco Use  ? Smoking status: Never  ? Smokeless tobacco: Never  ?Substance Use Topics  ? Alcohol use: No  ?  ?Family History  ?Problem Relation Age of Onset  ? Stroke Mother 22  ? Cancer Father   ?     lung cancer  ? Uterine cancer Sister 45  ? Dementia Sister   ? Dementia Sister   ? Hypertension Sister   ? Stroke Sister 57  ? Heart disease Sister 97  ?  ? ?  Review of Systems  ?Musculoskeletal:  Positive for arthralgias, back pain (chronic), gait problem and myalgias.  ?All other systems reviewed and are negative. ? ?Objective: ? ?Physical Exam ?Constitutional:   ?   Appearance: Normal appearance. She is normal weight.  ?HENT:  ?   Head: Normocephalic and atraumatic.  ?   Nose: Nose normal.  ?   Mouth/Throat:  ?   Mouth: Mucous membranes are moist.  ?   Pharynx: Oropharynx is clear.  ?Eyes:  ?   Extraocular Movements: Extraocular movements intact.  ?   Conjunctiva/sclera: Conjunctivae normal.  ?   Pupils: Pupils are equal, round, and reactive to light.  ?Cardiovascular:  ?   Rate and Rhythm: Normal rate and regular rhythm.  ?   Pulses: Normal pulses.  ?   Heart sounds: Normal heart sounds.  ?Pulmonary:  ?    Effort: Pulmonary effort is normal.  ?   Breath sounds: Normal breath sounds.  ?Abdominal:  ?   General: Abdomen is flat.  ?   Palpations: Abdomen is soft.  ?Genitourinary: ?   Comments: deferred ?Musculoskeletal:  ?   Cervical back: Normal range of motion and neck supple.  ?   Right hip: Tenderness and bony tenderness present. Decreased range of motion.  ?Skin: ?   General: Skin is warm and dry.  ?   Capillary Refill: Capillary refill takes less than 2 seconds.  ?Neurological:  ?   General: No focal deficit present.  ?   Mental Status: She is alert and oriented to person, place, and time.  ?Psychiatric:     ?   Mood and Affect: Mood normal.     ?   Behavior: Behavior normal.     ?   Thought Content: Thought content normal.     ?   Judgment: Judgment normal.  ? ? ?Vital signs in last 24 hours: ?@VSRANGES@ ? ?Labs: ? ? ?Estimated body mass index is 24.53 kg/m? as calculated from the following: ?  Height as of 07/27/21: 5' 1" (1.549 m). ?  Weight as of 07/27/21: 58.9 kg. ? ? ?Imaging Review ?Plain radiographs demonstrate severe degenerative joint disease of the right hip(s). The bone quality appears to be adequate for age and reported activity level. ? ? ? ? ? ?Assessment/Plan: ? ?End stage arthritis, right hip(s) ? ?The patient history, physical examination, clinical judgement of the provider and imaging studies are consistent with end stage degenerative joint disease of the right hip(s) and total hip arthroplasty is deemed medically necessary. The treatment options including medical management, injection therapy, arthroscopy and arthroplasty were discussed at length. The risks and benefits of total hip arthroplasty were presented and reviewed. The risks due to aseptic loosening, infection, stiffness, dislocation/subluxation,  thromboembolic complications and other imponderables were discussed.  The patient acknowledged the explanation, agreed to proceed with the plan and consent was signed. Patient is being admitted for  inpatient treatment for surgery, pain control, PT, OT, prophylactic antibiotics, VTE prophylaxis, progressive ambulation and ADL's and discharge planning.The patient is planning to be discharged home after o/n stay due to cardiac history. Home exercise program with focus on abductor strengthening with standing side leg raises and stationary bike/elliptical.  ? ? ? ?Patient's anticipated LOS is less than 2 midnights, meeting these requirements: ?- Younger than 65 ?- Lives within 1 hour of care ?- Has a competent adult at home to recover with post-op recover ?- NO history of ? - Chronic pain requiring opiods ? - Diabetes ? -   Coronary Artery Disease ? - Heart failure ? - Heart attack ? - Stroke ? - DVT/VTE ? - Cardiac arrhythmia ? - Respiratory Failure/COPD ? - Renal failure ? - Anemia ? - Advanced Liver disease ? ? ?

## 2021-09-14 NOTE — Progress Notes (Addendum)
Anesthesia Review: ? ?PCP: was DR Catha Gosselin but retired per pt  ?[facility] - pt reports she will be switching to another doctor did not say whom  ?Cardiologist : DR Rennis Golden- LOV 07/27/21  - had hoarsemess at this appt to be followed by ENT per note  ?LOV Jessica ensely,NP- 08/30/21 on chart along with clearance  ?CBC and BMp and hgba1c- 08/30/21- on chart  ?Called and LVMM for [facility] at [facility] and requested clearances for pt pt states she had to get clearance form cardiology and PCP.   ?Chest x-ray : ?EKG : 09/16/21  ?Echo : ?Stress test:2019 ?Cardiac Cath :  ?Activity level: can do a flight of stairs without difficulty  ?Sleep Study/ CPAP : none  ?Fasting Blood Sugar :      / Checks Blood Sugar -- times a day:   ?Blood Thinner/ Instructions /Last Dose: ?ASA / Instructions/ Last Dose :   ?Hx of cabg in 2009  ?81 mg aspirin  ?

## 2021-09-14 NOTE — Progress Notes (Addendum)
DUE TO COVID-19 ONLY  two VISITOR IS ALLOWED TO COME WITH YOU AND STAY IN THE WAITING ROOM ONLY DURING PRE OP AND PROCEDURE DAY OF SURGERY.  4  VISITOR  MAY VISIT WITH YOU AFTER SURGERY IN YOUR PRIVATE ROOM DURING VISITING HOURS ONLY! ?YOU MAY HAVE ONE PERSON SPEND THE NITE WITH YOU IN YOUR ROOM AFTER SURGERY.   ? ? ? ? Your procedure is scheduled on:  ?  09/23/2021  ? Report to [facility] Main  Entrance ? ? Report to admitting at      0515            AM ?DO NOT BRING INSURANCE CARD, PICTURE ID OR WALLET DAY OF SURGERY.  ?  ? ? Call this number if you have problems the morning of surgery [phone]  ? ? REMEMBER: NO  SOLID FOODS , CANDY, GUM OR MINTS AFTER MIDNITE THE NITE BEFORE SURGERY .       Marland Kitchen CLEAR LIQUIDS UNTIL    0430am             DAY OF SURGERY.      PLEASE FINISH ENSURE DRINK PER SURGEON ORDER  WHICH NEEDS TO BE COMPLETED AT   0430am       MORNING OF SURGERY.   ? ? ? ? ?CLEAR LIQUID DIET ? ? ?Foods Allowed      ?WATER ?BLACK COFFEE ( SUGAR OK, NO MILK, CREAM OR CREAMER) REGULAR AND DECAF  ?TEA ( SUGAR OK NO MILK, CREAM, OR CREAMER) REGULAR AND DECAF  ?PLAIN JELLO ( NO RED)  ?FRUIT ICES ( NO RED, NO FRUIT PULP)  ?POPSICLES ( NO RED)  ?JUICE- APPLE, WHITE GRAPE AND WHITE CRANBERRY  ?SPORT DRINK LIKE GATORADE ( NO RED)  ?CLEAR BROTH ( VEGETABLE , CHICKEN OR BEEF)                                                               ? ?    ? ?BRUSH YOUR TEETH MORNING OF SURGERY AND RINSE YOUR MOUTH OUT, NO CHEWING GUM CANDY OR MINTS. ?  ? ? Take these medicines the morning of surgery with A SIP OF WATER: metoprolol  ? ? ?DO NOT TAKE ANY DIABETIC MEDICATIONS DAY OF YOUR SURGERY ?                  ?            You may not have any metal on your body including hair pins and  ?            piercings  Do not wear jewelry, make-up, lotions, powders or perfumes, deodorant ?            Do not wear nail polish on your fingernails.   ?           IF YOU ARE A FEMALE AND WANT TO SHAVE UNDER ARMS OR LEGS PRIOR TO SURGERY  YOU MUST DO SO AT LEAST 48 HOURS PRIOR TO SURGERY.  ?            Men may shave face and neck. ? ? Do not bring valuables to the hospital. [facility] IS NOT ?            RESPONSIBLE  FOR VALUABLES. ? Contacts, dentures or bridgework may not be worn into surgery. ? Leave suitcase in the car. After surgery it may be brought to your room. ? ?  ? Patients discharged the day of surgery will not be allowed to drive home. IF YOU ARE HAVING SURGERY AND GOING HOME THE SAME DAY, YOU MUST HAVE AN ADULT TO DRIVE YOU HOME AND BE WITH YOU FOR 24 HOURS. YOU MAY GO HOME BY TAXI OR UBER OR ORTHERWISE, BUT AN ADULT MUST ACCOMPANY YOU HOME AND STAY WITH YOU FOR 24 HOURS. ?  ? ?            Please read over the following fact sheets you were given: ?_____________________________________________________________________ ? ?[facility] - Preparing for Surgery ?Before surgery, you can play an important role.  Because skin is not sterile, your skin needs to be as free of germs as possible.  You can reduce the number of germs on your skin by washing with CHG (chlorahexidine gluconate) soap before surgery.  CHG is an antiseptic cleaner which kills germs and bonds with the skin to continue killing germs even after washing. ?Please DO NOT use if you have an allergy to CHG or antibacterial soaps.  If your skin becomes reddened/irritated stop using the CHG and inform your nurse when you arrive at Short Stay. ?Do not shave (including legs and underarms) for at least 48 hours prior to the first CHG shower.  You may shave your face/neck. ?Please follow these instructions carefully: ? 1.  Shower with CHG Soap the night before surgery and the  morning of Surgery. ? 2.  If you choose to wash your hair, wash your hair first as usual with your  normal  shampoo. ? 3.  After you shampoo, rinse your hair and body thoroughly to remove the  shampoo.                           4.  Use CHG as you would any other liquid soap.  You can apply chg directly  to the  skin and wash  ?                     Gently with a scrungie or clean washcloth. ? 5.  Apply the CHG Soap to your body ONLY FROM THE NECK DOWN.   Do not use on face/ open      ?                     Wound or open sores. Avoid contact with eyes, ears mouth and genitals (private parts).  ?                     Production manager,  Genitals (private parts) with your normal soap. ?            6.  Wash thoroughly, paying special attention to the area where your surgery  will be performed. ? 7.  Thoroughly rinse your body with warm water from the neck down. ? 8.  DO NOT shower/wash with your normal soap after using and rinsing off  the CHG Soap. ?               9.  Pat yourself dry with a clean towel. ?           10.  Wear clean pajamas. ?  11.  Place clean sheets on your bed the night of your first shower and do not  sleep with pets. ?Day of Surgery : ?Do not apply any lotions/deodorants the morning of surgery.  Please wear clean clothes to the hospital/surgery center. ? ?FAILURE TO FOLLOW THESE INSTRUCTIONS MAY RESULT IN THE CANCELLATION OF YOUR SURGERY ?PATIENT SIGNATURE_________________________________ ? ?NURSE SIGNATURE__________________________________ ? ?________________________________________________________________________  ? ? ?           ?

## 2021-09-14 NOTE — H&P (Signed)
TOTAL HIP ADMISSION H&P ? ?Patient is admitted for right total hip arthroplasty. ? ?Subjective: ? ?Chief Complaint: right hip pain ? ?HPI: [name], 87 y.o. female, has a history of pain and functional disability in the right hip(s) due to arthritis and patient has failed non-surgical conservative treatments for greater than 12 weeks to include NSAID's and/or analgesics, flexibility and strengthening excercises, supervised PT with diminished ADL's post treatment, use of assistive devices, and activity modification.  Onset of symptoms was gradual starting 10 years ago with rapidlly worsening course since that time.The patient noted no past surgery on the right hip(s).  Patient currently rates pain in the right hip at 10 out of 10 with activity. Patient has night pain, worsening of pain with activity and weight bearing, pain that interfers with activities of daily living, and pain with passive range of motion. Patient has evidence of subchondral cysts, subchondral sclerosis, periarticular osteophytes, and joint space narrowing by imaging studies. This condition presents safety issues increasing the risk of falls. There is no current active infection. ? ?Patient Active Problem List  ? Diagnosis Date Noted  ? Statin intolerance 11/10/2017  ? Hyperlipidemia LDL goal <70 01/04/2016  ? Ischemic heart disease 01/10/2014  ? Benign hypertensive heart disease without heart failure 07/14/2011  ? Hx of CABG 07/14/2011  ? Essential hypertension   ? Hypercholesterolemia   ? Coronary artery disease   ? Nausea   ? Myalgia   ? ?Past Medical History:  ?Diagnosis Date  ? Cataract   ? Coronary artery disease   ? Hypercholesterolemia   ? Hypertension   ? ESSENTIAL  ? Myalgia   ? Nausea   ?  ?Past Surgical History:  ?Procedure Laterality Date  ? CORONARY ARTERY BYPASS GRAFT    ?  ?Current Outpatient Medications  ?Medication Sig Dispense Refill Last Dose  ? aspirin 81 MG tablet Take 81 mg by mouth daily.       ? Coenzyme Q10 (CO Q  10) 60 MG CAPS See admin instructions.     ? losartan-hydrochlorothiazide (HYZAAR) 100-12.5 MG tablet Take 1 tablet by mouth once daily 90 tablet 0   ? metoprolol tartrate (LOPRESSOR) 25 MG tablet Take 1 tablet by mouth twice daily 180 tablet 0   ? Multiple Vitamin (MULTIVITAMIN) tablet Take 1 tablet by mouth daily.       ? Omega 3 1200 MG CAPS 1 capsule     ? simvastatin (ZOCOR) 40 MG tablet TAKE 1 TABLET BY MOUTH AT BEDTIME 90 tablet 3   ? ?No current facility-administered medications for this visit.  ? ?Allergies  ?Allergen Reactions  ? Amiodarone   ?  rash  ? Crestor [Rosuvastatin Calcium]   ?  Leg pain  ? Ramipril   ?  cough  ? Simvastatin   ?  Week hourse voice  ? Sulfa Antibiotics   ?  Other reaction(s): stomach upset  ? Zetia [Ezetimibe] Other (See Comments)  ?  Muscle pain  ?  ?Social History  ? ?Tobacco Use  ? Smoking status: Never  ? Smokeless tobacco: Never  ?Substance Use Topics  ? Alcohol use: No  ?  ?Family History  ?Problem Relation Age of Onset  ? Stroke Mother 22  ? Cancer Father   ?     lung cancer  ? Uterine cancer Sister 45  ? Dementia Sister   ? Dementia Sister   ? Hypertension Sister   ? Stroke Sister 57  ? Heart disease Sister 97  ?  ? ?  Review of Systems  ?Musculoskeletal:  Positive for arthralgias, back pain (chronic), gait problem and myalgias.  ?All other systems reviewed and are negative. ? ?Objective: ? ?Physical Exam ?Constitutional:   ?   Appearance: Normal appearance. She is normal weight.  ?HENT:  ?   Head: Normocephalic and atraumatic.  ?   Nose: Nose normal.  ?   Mouth/Throat:  ?   Mouth: Mucous membranes are moist.  ?   Pharynx: Oropharynx is clear.  ?Eyes:  ?   Extraocular Movements: Extraocular movements intact.  ?   Conjunctiva/sclera: Conjunctivae normal.  ?   Pupils: Pupils are equal, round, and reactive to light.  ?Cardiovascular:  ?   Rate and Rhythm: Normal rate and regular rhythm.  ?   Pulses: Normal pulses.  ?   Heart sounds: Normal heart sounds.  ?Pulmonary:  ?    Effort: Pulmonary effort is normal.  ?   Breath sounds: Normal breath sounds.  ?Abdominal:  ?   General: Abdomen is flat.  ?   Palpations: Abdomen is soft.  ?Genitourinary: ?   Comments: deferred ?Musculoskeletal:  ?   Cervical back: Normal range of motion and neck supple.  ?   Right hip: Tenderness and bony tenderness present. Decreased range of motion.  ?Skin: ?   General: Skin is warm and dry.  ?   Capillary Refill: Capillary refill takes less than 2 seconds.  ?Neurological:  ?   General: No focal deficit present.  ?   Mental Status: She is alert and oriented to person, place, and time.  ?Psychiatric:     ?   Mood and Affect: Mood normal.     ?   Behavior: Behavior normal.     ?   Thought Content: Thought content normal.     ?   Judgment: Judgment normal.  ? ? ?Vital signs in last 24 hours: ?@VSRANGES @ ? ?Labs: ? ? ?Estimated body mass index is 24.53 kg/m? as calculated from the following: ?  Height as of 07/27/21: 5\' 1"  (1.549 m). ?  Weight as of 07/27/21: 58.9 kg. ? ? ?Imaging Review ?Plain radiographs demonstrate severe degenerative joint disease of the right hip(s). The bone quality appears to be adequate for age and reported activity level. ? ? ? ? ? ?Assessment/Plan: ? ?End stage arthritis, right hip(s) ? ?The patient history, physical examination, clinical judgement of the provider and imaging studies are consistent with end stage degenerative joint disease of the right hip(s) and total hip arthroplasty is deemed medically necessary. The treatment options including medical management, injection therapy, arthroscopy and arthroplasty were discussed at length. The risks and benefits of total hip arthroplasty were presented and reviewed. The risks due to aseptic loosening, infection, stiffness, dislocation/subluxation,  thromboembolic complications and other imponderables were discussed.  The patient acknowledged the explanation, agreed to proceed with the plan and consent was signed. Patient is being admitted for  inpatient treatment for surgery, pain control, PT, OT, prophylactic antibiotics, VTE prophylaxis, progressive ambulation and ADL's and discharge planning.The patient is planning to be discharged home after o/n stay due to cardiac history. Home exercise program with focus on abductor strengthening with standing side leg raises and stationary bike/elliptical.  ? ? ? ?Patient's anticipated LOS is less than 2 midnights, meeting these requirements: ?- Younger than 65 ?- Lives within 1 hour of care ?- Has a competent adult at home to recover with post-op recover ?- NO history of ? - Chronic pain requiring opiods ? - Diabetes ? -  Coronary Artery Disease ? - Heart failure ? - Heart attack ? - Stroke ? - DVT/VTE ? - Cardiac arrhythmia ? - Respiratory Failure/COPD ? - Renal failure ? - Anemia ? - Advanced Liver disease ? ? ?

## 2021-09-15 ENCOUNTER — Encounter (HOSPITAL_COMMUNITY): Payer: Self-pay

## 2021-09-15 ENCOUNTER — Other Ambulatory Visit: Payer: Self-pay

## 2021-09-15 DIAGNOSIS — I10 Essential (primary) hypertension: Secondary | ICD-10-CM | POA: Diagnosis not present

## 2021-09-15 DIAGNOSIS — Z01818 Encounter for other preprocedural examination: Secondary | ICD-10-CM | POA: Diagnosis present

## 2021-09-16 ENCOUNTER — Encounter (HOSPITAL_COMMUNITY)
Admission: RE | Admit: 2021-09-16 | Discharge: 2021-09-16 | Disposition: A | Discharge status: Home / Self Care | Payer: Medicare HMO | Source: Ambulatory Visit | Attending: Orthopedic Surgery | Admitting: Orthopedic Surgery

## 2021-09-16 VITALS — BP 115/76 | HR 69 | Temp 97.9°F | Resp 16 | Ht 61.0 in | Wt 130.0 lb

## 2021-09-16 DIAGNOSIS — I10 Essential (primary) hypertension: Secondary | ICD-10-CM | POA: Insufficient documentation

## 2021-09-16 DIAGNOSIS — Z01818 Encounter for other preprocedural examination: Secondary | ICD-10-CM | POA: Diagnosis not present

## 2021-09-16 HISTORY — DX: Unspecified osteoarthritis, unspecified site: M19.90

## 2021-09-16 LAB — SURGICAL PCR SCREEN
MRSA, PCR: NEGATIVE
Staphylococcus aureus: NEGATIVE

## 2021-09-17 NOTE — Progress Notes (Signed)
Anesthesia Chart Review: ? ? Case: [id number] Date/Time: 09/23/21 0715  ? Procedure: TOTAL HIP ARTHROPLASTY ANTERIOR APPROACH (Right: Hip)  ? Anesthesia type: Spinal  ? Pre-op diagnosis: Right hip osteoarthritis  ? Location: [facility] ROOM 08 / [facility]  ? Surgeons: Samson Frederic, MD  ? ?  ? ? ?DISCUSSION: ?Pt is 87 years old with hx CAD (s/p CABG x4 in 2009), HTN ? ?VS: BP 115/76   Pulse 69   Temp 36.6 ?C (Oral)   Resp 16   Ht 5\' 1"  (1.549 m)   Wt 59 kg   BMI 24.56 kg/m?  ? ?PROVIDERS: ?- Receives primary care at [facility], [facility] [place]. Cleared for surgery at low risk from primary care standpoint by Deboraha Sprang, NP ?- Cardiologist is Dennie Maizes, MD. Last office visit 07/27/21. Cleared for surgery at moderate risk for cardiac complications by 09/24/21, NP on 08/25/21 ? ? ?LABS: Labs reviewed: Acceptable for surgery. ?(all labs ordered are listed, but only abnormal results are displayed) ?(Lab results listed below from PCP's office - see paper chart) ?- HbA1c was 5.8 08/31/10   ?- BMP 08/30/21: glucose 105, BUN 33, Cr 1.24; otherwise normal ?- CBC w/diff 08/30/21: normal ? ?Labs Reviewed  ?SURGICAL PCR SCREEN  ?TYPE AND SCREEN  ? ? ?EKG 09/16/21: NSR. Nonspecific ST abnormality ? ? ?CV: ?Nuclear stress test 11/15/17:  ?Nuclear stress EF: 64%. The left ventricular ejection fraction is normal (55-65%). ?There was no ST segment deviation noted during stress. ?The study is normal. There is no evidence of ischemia. no evidence of infarction ?This is a low risk study. ? ?Past Medical History:  ?Diagnosis Date  ? Arthritis   ? Cataract   ? Coronary artery disease   ? Hypercholesterolemia   ? Hypertension   ? ESSENTIAL  ? Myalgia   ? Nausea   ? ? ?Past Surgical History:  ?Procedure Laterality Date  ? CORONARY ARTERY BYPASS GRAFT    ? ? ?MEDICATIONS: ? aspirin 81 MG tablet  ? Coenzyme Q10 (CO Q 10) 60 MG CAPS  ? losartan-hydrochlorothiazide (HYZAAR) 100-12.5 MG tablet  ? metoprolol tartrate (LOPRESSOR) 25  MG tablet  ? Multiple Vitamin (MULTIVITAMIN) tablet  ? Omega 3 1000 MG CAPS  ? simvastatin (ZOCOR) 40 MG tablet  ? ?No current facility-administered medications for this encounter.  ? ? ?If no changes, I anticipate pt can proceed with surgery as scheduled.  ? ?11/17/17, PhD, FNP-BC ?[facility]/Anesthesiology ?Phone: [phone] ?09/17/2021 12:08 PM ? ? ? ? ? ? ?

## 2021-09-17 NOTE — Anesthesia Preprocedure Evaluation (Addendum)
Anesthesia Evaluation  ?Patient identified by MRN, date of birth, ID band ?Patient awake ? ? ? ?Reviewed: ?Allergy & Precautions, H&P , NPO status , Patient's Chart, lab work & pertinent test results, reviewed documented beta blocker date and time  ? ?Airway ?Mallampati: II ? ?TM Distance: >3 FB ?Neck ROM: full ? ? ? Dental ?no notable dental hx. ?(+) Teeth Intact, Dental Advisory Given, Missing,  ?  ?Pulmonary ?neg pulmonary ROS,  ?  ?Pulmonary exam normal ?breath sounds clear to auscultation ? ? ? ? ? ? Cardiovascular ?Exercise Tolerance: Good ?hypertension, Pt. on medications ?+ CAD and + CABG  ? ?Rhythm:regular Rate:Normal ? ?CV: ?Nuclear stress test 11/15/17:  ?? Nuclear stress EF: 64%. The left ventricular ejection fraction is normal (55-65%). ?? There was no ST segment deviation noted during stress. ?? The study is normal. There is no evidence of ischemia. no evidence of infarction ?? This is a low risk study. ?  ?Neuro/Psych ?negative neurological ROS ? negative psych ROS  ? GI/Hepatic ?negative GI ROS, Neg liver ROS,   ?Endo/Other  ?negative endocrine ROS ? Renal/GU ?negative Renal ROS  ?negative genitourinary ?  ?Musculoskeletal ? ?(+) Arthritis , Osteoarthritis,   ? Abdominal ?  ?Peds ? Hematology ?negative hematology ROS ?(+)   ?Anesthesia Other Findings ? ? Reproductive/Obstetrics ?negative OB ROS ? ?  ? ? ? ? ? ? ? ? ? ? ? ? ? ?  ?  ? ? ? ? ? ? ? ?Anesthesia Physical ?Anesthesia Plan ? ?ASA: 3 ? ?Anesthesia Plan: General  ? ?Post-op Pain Management: Minimal or no pain anticipated  ? ?Induction: Intravenous ? ?PONV Risk Score and Plan: 3 and Ondansetron, Dexamethasone and Treatment may vary due to age or medical condition ? ?Airway Management Planned: Oral ETT ? ?Additional Equipment: None ? ?Intra-op Plan:  ? ?Post-operative Plan:  ? ?Informed Consent: I have reviewed the patients History and Physical, chart, labs and discussed the procedure including the risks,  benefits and alternatives for the proposed anesthesia with the patient or authorized representative who has indicated his/her understanding and acceptance.  ? ? ? ?Dental Advisory Given ? ?Plan Discussed with: CRNA and Anesthesiologist ? ?Anesthesia Plan Comments: (See APP note by Joslyn Hy, FNP )  ? ? ? ? ? ?Anesthesia Quick Evaluation ? ?

## 2021-09-23 ENCOUNTER — Other Ambulatory Visit: Payer: Self-pay

## 2021-09-23 ENCOUNTER — Encounter (HOSPITAL_COMMUNITY)
Admission: RE | Disposition: A | Discharge status: Home / Self Care | Payer: Self-pay | Source: Home / Self Care | Attending: Orthopedic Surgery

## 2021-09-23 ENCOUNTER — Ambulatory Visit (HOSPITAL_COMMUNITY)
Admission: RE | Admit: 2021-09-23 | Discharge: 2021-09-24 | Disposition: A | Discharge status: Home / Self Care | Payer: Medicare HMO | Attending: Orthopedic Surgery | Admitting: Orthopedic Surgery

## 2021-09-23 ENCOUNTER — Ambulatory Visit (HOSPITAL_COMMUNITY): Payer: Medicare HMO

## 2021-09-23 ENCOUNTER — Ambulatory Visit (HOSPITAL_BASED_OUTPATIENT_CLINIC_OR_DEPARTMENT_OTHER): Payer: Medicare HMO | Admitting: Anesthesiology

## 2021-09-23 ENCOUNTER — Ambulatory Visit (HOSPITAL_COMMUNITY): Payer: Medicare HMO | Admitting: Emergency Medicine

## 2021-09-23 ENCOUNTER — Encounter (HOSPITAL_COMMUNITY): Payer: Self-pay | Admitting: Orthopedic Surgery

## 2021-09-23 DIAGNOSIS — I2510 Atherosclerotic heart disease of native coronary artery without angina pectoris: Secondary | ICD-10-CM | POA: Diagnosis not present

## 2021-09-23 DIAGNOSIS — M1611 Unilateral primary osteoarthritis, right hip: Secondary | ICD-10-CM | POA: Diagnosis present

## 2021-09-23 DIAGNOSIS — Z951 Presence of aortocoronary bypass graft: Secondary | ICD-10-CM | POA: Diagnosis not present

## 2021-09-23 DIAGNOSIS — Z955 Presence of coronary angioplasty implant and graft: Secondary | ICD-10-CM | POA: Diagnosis not present

## 2021-09-23 DIAGNOSIS — E7800 Pure hypercholesterolemia, unspecified: Secondary | ICD-10-CM | POA: Diagnosis not present

## 2021-09-23 DIAGNOSIS — M1990 Unspecified osteoarthritis, unspecified site: Secondary | ICD-10-CM | POA: Diagnosis not present

## 2021-09-23 DIAGNOSIS — I10 Essential (primary) hypertension: Secondary | ICD-10-CM

## 2021-09-23 DIAGNOSIS — Z471 Aftercare following joint replacement surgery: Secondary | ICD-10-CM | POA: Diagnosis not present

## 2021-09-23 DIAGNOSIS — Z96641 Presence of right artificial hip joint: Secondary | ICD-10-CM | POA: Diagnosis not present

## 2021-09-23 HISTORY — PX: TOTAL HIP ARTHROPLASTY: SHX124

## 2021-09-23 LAB — TYPE AND SCREEN
ABO/RH(D): A POS
Antibody Screen: NEGATIVE

## 2021-09-23 SURGERY — ARTHROPLASTY, HIP, TOTAL, ANTERIOR APPROACH
Anesthesia: General | Site: Hip | Laterality: Right

## 2021-09-23 MED ORDER — ONDANSETRON HCL 4 MG/2ML IJ SOLN
INTRAMUSCULAR | Status: AC
  Filled 2021-09-23: qty 2

## 2021-09-23 MED ORDER — DIPHENHYDRAMINE HCL 12.5 MG/5ML PO ELIX: 12.5000 mg | ORAL_SOLUTION | ORAL | Status: DC | PRN

## 2021-09-23 MED ORDER — FENTANYL CITRATE (PF) 100 MCG/2ML IJ SOLN
INTRAMUSCULAR | Status: AC
Start: 2021-09-23 — End: ?
  Filled 2021-09-23: qty 2

## 2021-09-23 MED ORDER — PHENYLEPHRINE HCL (PRESSORS) 10 MG/ML IV SOLN
INTRAVENOUS | Status: AC
  Filled 2021-09-23: qty 1

## 2021-09-23 MED ORDER — ISOPROPYL ALCOHOL 70 % SOLN
Status: AC
  Filled 2021-09-23: qty 480

## 2021-09-23 MED ORDER — ISOPROPYL ALCOHOL 70 % SOLN
Status: DC | PRN
  Administered 2021-09-23: 1 via TOPICAL

## 2021-09-23 MED ORDER — BUPIVACAINE-EPINEPHRINE 0.25% -1:200000 IJ SOLN
INTRAMUSCULAR | Status: DC | PRN
  Administered 2021-09-23: 30 mL

## 2021-09-23 MED ORDER — MEPERIDINE HCL 50 MG/ML IJ SOLN: 6.2500 mg | INTRAMUSCULAR | Status: DC | PRN

## 2021-09-23 MED ORDER — ONDANSETRON HCL 4 MG/2ML IJ SOLN: 4.0000 mg | Freq: Once | INTRAMUSCULAR | Status: DC | PRN

## 2021-09-23 MED ORDER — TRANEXAMIC ACID-NACL 1000-0.7 MG/100ML-% IV SOLN
1000.0000 mg | INTRAVENOUS | Status: AC
  Administered 2021-09-23: 1000 mg via INTRAVENOUS
  Filled 2021-09-23: qty 100

## 2021-09-23 MED ORDER — ACETAMINOPHEN 325 MG PO TABS: 325.0000 mg | ORAL_TABLET | ORAL | Status: DC | PRN

## 2021-09-23 MED ORDER — DEXAMETHASONE SODIUM PHOSPHATE 10 MG/ML IJ SOLN
INTRAMUSCULAR | Status: DC | PRN
  Administered 2021-09-23: 10 mg via INTRAVENOUS

## 2021-09-23 MED ORDER — ONDANSETRON HCL 4 MG/2ML IJ SOLN
4.0000 mg | Freq: Four times a day (QID) | INTRAMUSCULAR | Status: DC | PRN
  Administered 2021-09-24 (×2): 4 mg via INTRAVENOUS
  Filled 2021-09-23 (×2): qty 2

## 2021-09-23 MED ORDER — ACETAMINOPHEN 500 MG PO TABS
1000.0000 mg | ORAL_TABLET | Freq: Once | ORAL | Status: AC
  Administered 2021-09-23: 1000 mg via ORAL
  Filled 2021-09-23: qty 2

## 2021-09-23 MED ORDER — ONDANSETRON HCL 4 MG PO TABS: 4.0000 mg | ORAL_TABLET | Freq: Four times a day (QID) | ORAL | Status: DC | PRN

## 2021-09-23 MED ORDER — FENTANYL CITRATE (PF) 100 MCG/2ML IJ SOLN
INTRAMUSCULAR | Status: AC
  Filled 2021-09-23: qty 2

## 2021-09-23 MED ORDER — POLYETHYLENE GLYCOL 3350 17 G PO PACK: 17.0000 g | PACK | Freq: Every day | ORAL | Status: DC | PRN

## 2021-09-23 MED ORDER — LACTATED RINGERS IV SOLN
INTRAVENOUS | Status: DC
Start: 2021-09-23 — End: 2021-09-23

## 2021-09-23 MED ORDER — ALUM & MAG HYDROXIDE-SIMETH 200-200-20 MG/5ML PO SUSP: 30.0000 mL | ORAL | Status: DC | PRN

## 2021-09-23 MED ORDER — FENTANYL CITRATE PF 50 MCG/ML IJ SOSY: 25.0000 ug | PREFILLED_SYRINGE | INTRAMUSCULAR | Status: DC | PRN

## 2021-09-23 MED ORDER — METOCLOPRAMIDE HCL 5 MG/ML IJ SOLN: 5.0000 mg | Freq: Three times a day (TID) | INTRAMUSCULAR | Status: DC | PRN

## 2021-09-23 MED ORDER — OXYCODONE HCL 5 MG/5ML PO SOLN: 5.0000 mg | Freq: Once | ORAL | Status: DC | PRN

## 2021-09-23 MED ORDER — PROPOFOL 10 MG/ML IV BOLUS
INTRAVENOUS | Status: AC
  Filled 2021-09-23: qty 20

## 2021-09-23 MED ORDER — CEFAZOLIN SODIUM-DEXTROSE 2-4 GM/100ML-% IV SOLN
2.0000 g | INTRAVENOUS | Status: AC
  Administered 2021-09-23: 2 g via INTRAVENOUS
  Filled 2021-09-23: qty 100

## 2021-09-23 MED ORDER — METOCLOPRAMIDE HCL 5 MG PO TABS: 5.0000 mg | ORAL_TABLET | Freq: Three times a day (TID) | ORAL | Status: DC | PRN

## 2021-09-23 MED ORDER — SODIUM CHLORIDE (PF) 0.9 % IJ SOLN
INTRAMUSCULAR | Status: DC | PRN
  Administered 2021-09-23: 30 mL via INTRAVENOUS

## 2021-09-23 MED ORDER — ACETAMINOPHEN 160 MG/5ML PO SOLN: 325.0000 mg | ORAL | Status: DC | PRN

## 2021-09-23 MED ORDER — ONDANSETRON HCL 4 MG/2ML IJ SOLN
INTRAMUSCULAR | Status: DC | PRN
Start: 2021-09-23 — End: 2021-09-23
  Administered 2021-09-23: 4 mg via INTRAVENOUS

## 2021-09-23 MED ORDER — POVIDONE-IODINE 10 % EX SWAB: 2.0000 "application " | Freq: Once | CUTANEOUS | Status: DC

## 2021-09-23 MED ORDER — SODIUM CHLORIDE 0.9 % IR SOLN
Status: DC | PRN
  Administered 2021-09-23: 1000 mL

## 2021-09-23 MED ORDER — SIMVASTATIN 40 MG PO TABS
40.0000 mg | ORAL_TABLET | Freq: Every day | ORAL | Status: DC
  Administered 2021-09-23: 40 mg via ORAL
  Filled 2021-09-23: qty 1

## 2021-09-23 MED ORDER — MORPHINE SULFATE (PF) 2 MG/ML IV SOLN: 0.5000 mg | INTRAVENOUS | Status: DC | PRN

## 2021-09-23 MED ORDER — PROPOFOL 10 MG/ML IV BOLUS
INTRAVENOUS | Status: DC | PRN
  Administered 2021-09-23: 80 mg via INTRAVENOUS

## 2021-09-23 MED ORDER — GLYCOPYRROLATE 0.2 MG/ML IJ SOLN
INTRAMUSCULAR | Status: DC | PRN
  Administered 2021-09-23: .1 mg via INTRAVENOUS

## 2021-09-23 MED ORDER — DOCUSATE SODIUM 100 MG PO CAPS
100.0000 mg | ORAL_CAPSULE | Freq: Two times a day (BID) | ORAL | Status: DC
  Administered 2021-09-23 – 2021-09-24 (×2): 100 mg via ORAL
  Filled 2021-09-23 (×2): qty 1

## 2021-09-23 MED ORDER — METHOCARBAMOL 500 MG PO TABS
500.0000 mg | ORAL_TABLET | Freq: Four times a day (QID) | ORAL | Status: DC | PRN
Start: 2021-09-23 — End: 2021-09-24
  Filled 2021-09-23: qty 1

## 2021-09-23 MED ORDER — ASPIRIN 81 MG PO CHEW
81.0000 mg | CHEWABLE_TABLET | Freq: Two times a day (BID) | ORAL | Status: DC
  Administered 2021-09-23 – 2021-09-24 (×2): 81 mg via ORAL
  Filled 2021-09-23 (×2): qty 1

## 2021-09-23 MED ORDER — FENTANYL CITRATE (PF) 100 MCG/2ML IJ SOLN
INTRAMUSCULAR | Status: DC | PRN
Start: 2021-09-23 — End: 2021-09-23
  Administered 2021-09-23: 50 ug via INTRAVENOUS
  Administered 2021-09-23: 100 ug via INTRAVENOUS
  Administered 2021-09-23: 50 ug via INTRAVENOUS

## 2021-09-23 MED ORDER — SUGAMMADEX SODIUM 200 MG/2ML IV SOLN
INTRAVENOUS | Status: DC | PRN
  Administered 2021-09-23: 118 mg via INTRAVENOUS

## 2021-09-23 MED ORDER — POVIDONE-IODINE 10 % EX SWAB
2.0000 "application " | Freq: Once | CUTANEOUS | Status: AC
  Administered 2021-09-23: 2 via TOPICAL

## 2021-09-23 MED ORDER — CHLORHEXIDINE GLUCONATE 0.12 % MT SOLN
15.0000 mL | Freq: Once | OROMUCOSAL | Status: AC
  Administered 2021-09-23: 15 mL via OROMUCOSAL

## 2021-09-23 MED ORDER — DEXAMETHASONE SODIUM PHOSPHATE 10 MG/ML IJ SOLN
INTRAMUSCULAR | Status: AC
  Filled 2021-09-23: qty 1

## 2021-09-23 MED ORDER — KETOROLAC TROMETHAMINE 30 MG/ML IJ SOLN
INTRAMUSCULAR | Status: DC | PRN
  Administered 2021-09-23: 30 mg via INTRAVENOUS

## 2021-09-23 MED ORDER — KETOROLAC TROMETHAMINE 30 MG/ML IJ SOLN
INTRAMUSCULAR | Status: AC
  Filled 2021-09-23: qty 1

## 2021-09-23 MED ORDER — PROPOFOL 500 MG/50ML IV EMUL: INTRAVENOUS | Status: DC | PRN

## 2021-09-23 MED ORDER — PHENOL 1.4 % MT LIQD: 1.0000 | OROMUCOSAL | Status: DC | PRN

## 2021-09-23 MED ORDER — SODIUM CHLORIDE (PF) 0.9 % IJ SOLN
INTRAMUSCULAR | Status: AC
  Filled 2021-09-23: qty 50

## 2021-09-23 MED ORDER — SENNA 8.6 MG PO TABS
1.0000 | ORAL_TABLET | Freq: Two times a day (BID) | ORAL | Status: DC
  Administered 2021-09-23 – 2021-09-24 (×2): 8.6 mg via ORAL
  Filled 2021-09-23 (×2): qty 1

## 2021-09-23 MED ORDER — BUPIVACAINE-EPINEPHRINE (PF) 0.25% -1:200000 IJ SOLN
INTRAMUSCULAR | Status: AC
  Filled 2021-09-23: qty 30

## 2021-09-23 MED ORDER — SODIUM CHLORIDE 0.9 % IV SOLN: INTRAVENOUS | Status: DC

## 2021-09-23 MED ORDER — OXYCODONE HCL 5 MG PO TABS: 5.0000 mg | ORAL_TABLET | Freq: Once | ORAL | Status: DC | PRN

## 2021-09-23 MED ORDER — MENTHOL 3 MG MT LOZG: 1.0000 | LOZENGE | OROMUCOSAL | Status: DC | PRN

## 2021-09-23 MED ORDER — PHENYLEPHRINE HCL-NACL 20-0.9 MG/250ML-% IV SOLN
INTRAVENOUS | Status: DC | PRN
  Administered 2021-09-23: 40 ug/min via INTRAVENOUS

## 2021-09-23 MED ORDER — LACTATED RINGERS IV SOLN: INTRAVENOUS | Status: DC

## 2021-09-23 MED ORDER — ORAL CARE MOUTH RINSE: 15.0000 mL | Freq: Once | OROMUCOSAL | Status: AC

## 2021-09-23 MED ORDER — WATER FOR IRRIGATION, STERILE IR SOLN
Status: DC | PRN
  Administered 2021-09-23: 2000 mL

## 2021-09-23 MED ORDER — LIDOCAINE 2% (20 MG/ML) 5 ML SYRINGE
INTRAMUSCULAR | Status: DC | PRN
  Administered 2021-09-23: 60 mg via INTRAVENOUS

## 2021-09-23 MED ORDER — METOPROLOL TARTRATE 25 MG PO TABS
25.0000 mg | ORAL_TABLET | Freq: Two times a day (BID) | ORAL | Status: DC
  Administered 2021-09-24: 25 mg via ORAL
  Filled 2021-09-23 (×2): qty 1

## 2021-09-23 MED ORDER — ROCURONIUM BROMIDE 10 MG/ML (PF) SYRINGE
PREFILLED_SYRINGE | INTRAVENOUS | Status: DC | PRN
  Administered 2021-09-23: 50 mg via INTRAVENOUS

## 2021-09-23 MED ORDER — CEFAZOLIN SODIUM-DEXTROSE 2-4 GM/100ML-% IV SOLN
2.0000 g | Freq: Four times a day (QID) | INTRAVENOUS | Status: AC
  Administered 2021-09-23 – 2021-09-24 (×2): 2 g via INTRAVENOUS
  Filled 2021-09-23 (×2): qty 100

## 2021-09-23 MED ORDER — HYDROCODONE-ACETAMINOPHEN 7.5-325 MG PO TABS
1.0000 | ORAL_TABLET | ORAL | Status: DC | PRN
  Administered 2021-09-23 (×2): 1 via ORAL
  Filled 2021-09-23 (×2): qty 1

## 2021-09-23 MED ORDER — ADULT MULTIVITAMIN W/MINERALS CH
1.0000 | ORAL_TABLET | Freq: Every day | ORAL | Status: DC
  Administered 2021-09-24: 1 via ORAL
  Filled 2021-09-23: qty 1

## 2021-09-23 MED ORDER — ACETAMINOPHEN 325 MG PO TABS: 325.0000 mg | ORAL_TABLET | Freq: Four times a day (QID) | ORAL | Status: DC | PRN

## 2021-09-23 MED ORDER — METHOCARBAMOL 1000 MG/10ML IJ SOLN
500.0000 mg | Freq: Four times a day (QID) | INTRAVENOUS | Status: DC | PRN
  Filled 2021-09-23: qty 5

## 2021-09-23 MED ORDER — HYDROCODONE-ACETAMINOPHEN 5-325 MG PO TABS
1.0000 | ORAL_TABLET | ORAL | Status: DC | PRN
  Administered 2021-09-24: 1 via ORAL
  Filled 2021-09-23: qty 1

## 2021-09-23 MED ORDER — EPHEDRINE SULFATE (PRESSORS) 50 MG/ML IJ SOLN
INTRAMUSCULAR | Status: DC | PRN
  Administered 2021-09-23 (×2): 10 mg via INTRAVENOUS
  Administered 2021-09-23: 5 mg via INTRAVENOUS

## 2021-09-23 SURGICAL SUPPLY — 57 items
BAG COUNTER SPONGE SURGICOUNT (BAG) IMPLANT
BAG DECANTER FOR FLEXI CONT (MISCELLANEOUS) ×1 IMPLANT
BAG ZIPLOCK 12X15 (MISCELLANEOUS) IMPLANT
CHLORAPREP W/TINT 26 (MISCELLANEOUS) ×2 IMPLANT
COVER PERINEAL POST (MISCELLANEOUS) ×2 IMPLANT
COVER SURGICAL LIGHT HANDLE (MISCELLANEOUS) ×2 IMPLANT
DERMABOND ADVANCED (GAUZE/BANDAGES/DRESSINGS) ×1
DERMABOND ADVANCED .7 DNX12 (GAUZE/BANDAGES/DRESSINGS) ×2 IMPLANT
DRAPE IMP U-DRAPE 54X76 (DRAPES) ×2 IMPLANT
DRAPE SHEET LG 3/4 BI-LAMINATE (DRAPES) ×6 IMPLANT
DRAPE STERI IOBAN 125X83 (DRAPES) ×2 IMPLANT
DRAPE U-SHAPE 47X51 STRL (DRAPES) ×4 IMPLANT
DRESSING AQUACEL AG SP 3.5X10 (GAUZE/BANDAGES/DRESSINGS) IMPLANT
DRSG AQUACEL AG ADV 3.5X10 (GAUZE/BANDAGES/DRESSINGS) ×2 IMPLANT
DRSG AQUACEL AG SP 3.5X10 (GAUZE/BANDAGES/DRESSINGS) ×2
ELECT REM PT RETURN 15FT ADLT (MISCELLANEOUS) ×2 IMPLANT
GAUZE SPONGE 4X4 12PLY STRL (GAUZE/BANDAGES/DRESSINGS) ×2 IMPLANT
GLOVE SURG ENC MOIS LTX SZ8.5 (GLOVE) ×4 IMPLANT
GLOVE SURG UNDER POLY LF SZ8.5 (GLOVE) ×2 IMPLANT
GOWN SPEC L3 XXLG W/TWL (GOWN DISPOSABLE) ×4 IMPLANT
HANDPIECE INTERPULSE COAX TIP (DISPOSABLE) ×2
HEAD FEM -3XOFST 36XMDLR (Head) IMPLANT
HEAD MODULAR 36MM (Head) ×2 IMPLANT
HOLDER FOLEY CATH W/STRAP (MISCELLANEOUS) ×2 IMPLANT
HOOD PEEL AWAY FLYTE STAYCOOL (MISCELLANEOUS) ×6 IMPLANT
KIT TURNOVER KIT A (KITS) IMPLANT
LINER G7 VIT E +5 36 D (Liner) ×1 IMPLANT
MANIFOLD NEPTUNE II (INSTRUMENTS) ×2 IMPLANT
MARKER SKIN DUAL TIP RULER LAB (MISCELLANEOUS) ×2 IMPLANT
NDL SAFETY ECLIPSE 18X1.5 (NEEDLE) ×1 IMPLANT
NDL SPNL 18GX3.5 QUINCKE PK (NEEDLE) ×1 IMPLANT
NEEDLE HYPO 18GX1.5 SHARP (NEEDLE) ×2
NEEDLE SPNL 18GX3.5 QUINCKE PK (NEEDLE) ×2 IMPLANT
PACK ANTERIOR HIP CUSTOM (KITS) ×2 IMPLANT
PENCIL SMOKE EVACUATOR (MISCELLANEOUS) ×1 IMPLANT
SAW OSC TIP CART 19.5X105X1.3 (SAW) ×2 IMPLANT
SEALER BIPOLAR AQUA 6.0 (INSTRUMENTS) ×2 IMPLANT
SET HNDPC FAN SPRY TIP SCT (DISPOSABLE) ×1 IMPLANT
SHELL ACET G7 3H 50 SZD (Shell) ×1 IMPLANT
SOLUTION PRONTOSAN WOUND 350ML (IRRIGATION / IRRIGATOR) ×2 IMPLANT
SPIKE FLUID TRANSFER (MISCELLANEOUS) ×2 IMPLANT
SPONGE T-LAP 18X18 ~~LOC~~+RFID (SPONGE) ×6 IMPLANT
STAPLER INSORB 30 2030 C-SECTI (MISCELLANEOUS) IMPLANT
STEM FEM CEMLS SZ 13 133D (Stem) ×1 IMPLANT
SUT MNCRL AB 3-0 PS2 18 (SUTURE) ×2 IMPLANT
SUT MON AB 2-0 CT1 36 (SUTURE) ×2 IMPLANT
SUT STRATAFIX PDO 1 14 VIOLET (SUTURE) ×2
SUT STRATFX PDO 1 14 VIOLET (SUTURE) ×1
SUT VIC AB 1 CTX 36 (SUTURE) ×2
SUT VIC AB 1 CTX36XBRD ANBCTR (SUTURE) IMPLANT
SUT VIC AB 2-0 CT1 27 (SUTURE) ×2
SUT VIC AB 2-0 CT1 TAPERPNT 27 (SUTURE) IMPLANT
SUTURE STRATFX PDO 1 14 VIOLET (SUTURE) ×1 IMPLANT
SYR 3ML LL SCALE MARK (SYRINGE) ×2 IMPLANT
TRAY FOLEY MTR SLVR 16FR STAT (SET/KITS/TRAYS/PACK) IMPLANT
TUBE SUCTION HIGH CAP CLEAR NV (SUCTIONS) ×2 IMPLANT
WATER STERILE IRR 1000ML POUR (IV SOLUTION) ×2 IMPLANT

## 2021-09-23 NOTE — Plan of Care (Signed)

## 2021-09-23 NOTE — Anesthesia Procedure Notes (Signed)
Procedure Name: Intubation ?Date/Time: 09/23/2021 3:01 PM ?Performed by: Gean Maidens, CRNA ?Pre-anesthesia Checklist: Patient identified, Emergency Drugs available, Suction available, Patient being monitored and Timeout performed ?Patient Re-evaluated:Patient Re-evaluated prior to induction ?Oxygen Delivery Method: Circle system utilized ?Preoxygenation: Pre-oxygenation with 100% oxygen ?Induction Type: IV induction ?Ventilation: Mask ventilation without difficulty ?Laryngoscope Size: Mac and 4 ?Grade View: Grade I ?Tube type: Oral ?Tube size: 7.0 mm ?Number of attempts: 1 ?Airway Equipment and Method: Stylet ?Placement Confirmation: ETT inserted through vocal cords under direct vision, positive ETCO2 and breath sounds checked- equal and bilateral ?Secured at: 21 cm ?Tube secured with: Tape ?Dental Injury: Teeth and Oropharynx as per pre-operative assessment  ? ? ? ? ?

## 2021-09-23 NOTE — Anesthesia Postprocedure Evaluation (Signed)
Anesthesia Post Note ? ?Patient: CAITLAND PORCHIA ? ?Procedure(s) Performed: TOTAL HIP ARTHROPLASTY ANTERIOR APPROACH (Right: Hip) ? ?  ? ?Patient location during evaluation: PACU ?Anesthesia Type: General ?Level of consciousness: awake and alert ?Pain management: pain level controlled ?Vital Signs Assessment: post-procedure vital signs reviewed and stable ?Respiratory status: spontaneous breathing, nonlabored ventilation, respiratory function stable and patient connected to nasal cannula oxygen ?Cardiovascular status: blood pressure returned to baseline and stable ?Postop Assessment: no apparent nausea or vomiting ?Anesthetic complications: no ? ? ?No notable events documented. ? ?Last Vitals:  ?Vitals:  ? 09/23/21 1745 09/23/21 1800  ?BP: (!) 118/59 (!) 100/55  ?Pulse: (!) 55 (!) 55  ?Resp: 14 15  ?Temp:    ?SpO2: 100% 98%  ?  ?Last Pain:  ?Vitals:  ? 09/23/21 1745  ?TempSrc:   ?PainSc: 0-No pain  ? ? ?  ?  ?  ?  ?  ?  ? ?Coe Angelos ? ? ? ? ?

## 2021-09-23 NOTE — Op Note (Signed)
OPERATIVE REPORT ? ?SURGEON: Samson Frederic, MD  ? ?ASSISTANT: Skip Mayer, PA-C ?Clint Bolder, PA-C ? ?PREOPERATIVE DIAGNOSIS: Right hip arthritis.  ? ?POSTOPERATIVE DIAGNOSIS: Right hip arthritis.  ? ?PROCEDURE: Right total hip arthroplasty, anterior approach.  ? ?IMPLANTS: Biomet Taperloc Complete Microplasty stem, size 13 x 111 mm, high offset. ?Biomet G7 OsseoTi Cup, size 50 mm. ?Biomet Vivacit-E liner, size 36 mm, D, +5 neutral. ?Biomet metal head ball, size 36 - 3 mm. ? ?ANESTHESIA:  General ? ?ESTIMATED BLOOD LOSS:-100 mL   ? ?ANTIBIOTICS: 2 g Ancef. ? ?DRAINS: None. ? ?COMPLICATIONS: None. ?  ?CONDITION: PACU - hemodynamically stable.  ? ?BRIEF CLINICAL NOTE: [name] is a 87 y.o. female with a long-standing history of Right hip arthritis. After failing conservative management, the patient was indicated for total hip arthroplasty. The risks, benefits, and alternatives to the procedure were explained, and the patient elected to proceed. ? ?PROCEDURE IN DETAIL: Surgical site was marked by myself in the pre-op holding area. Once inside the operating room, spinal anesthesia was obtained, and a foley catheter was inserted. The patient was then positioned on the Hana table.  All bony prominences were well padded.  The hip was prepped and draped in the normal sterile surgical fashion.  A time-out was called verifying side and site of surgery. The patient received IV antibiotics within 60 minutes of beginning the procedure. ?  ?Bikini incision was made, and superficial dissection was performed lateral to the ASIS. The direct anterior approach to the hip was performed through the Hueter interval.  Lateral femoral circumflex vessels were treated with the Auqumantys. The anterior capsule was exposed and an inverted T capsulotomy was made. The femoral neck cut was made to the level of the templated cut.  A corkscrew was placed into the head and the head was removed.  The femoral head was found to have  eburnated bone. The head was passed to the back table and was measured. Pubofemoral ligament was released off of the calcar, taking care to stay on bone. Superior capsule was released from the greater trochanter, taking care to stay lateral to the posterior border of the femoral neck in order to preserve the short external rotators. ?  ?Acetabular exposure was achieved, and the pulvinar and labrum were excised. Sequential reaming of the acetabulum was then performed up to a size 49 mm reamer. A 50 mm cup was then opened and impacted into place at approximately 40 degrees of abduction and 20 degrees of anteversion. The final polyethylene liner was impacted into place and acetabular osteophytes were removed.  ?  ?I then gained femoral exposure taking care to protect the abductors and greater trochanter.  This was performed using standard external rotation, extension, and adduction.  A cookie cutter was used to enter the femoral canal, and then the femoral canal finder was placed.  Sequential broaching was performed up to a size 13.  Calcar planer was used on the femoral neck remnant.  I placed a high offset neck and a trial head ball.  The hip was reduced.  Leg lengths and offset were checked fluoroscopically.  The hip was dislocated and trial components were removed.  The final implants were placed, and the hip was reduced.  Fluoroscopy was used to confirm component position and leg lengths.  At 90 degrees of external rotation and full extension, the hip was stable to an anterior directed force. ?  ?The wound was copiously irrigated with Prontosan solution and normal saline using pule  lavage.  Marcaine solution was injected into the periarticular soft tissue.  The wound was closed in layers using #1 Vicryl and V-Loc for the fascia, 2-0 Vicryl for the subcutaneous fat, 2-0 Monocryl for the deep dermal layer, 3-0 running Monocryl subcuticular stitch, and Dermabond for the skin.  Once the glue was fully dried, an  Aquacell Ag dressing was applied.  The patient was transported to the recovery room in stable condition.  Sponge, needle, and instrument counts were correct at the end of the case x2.  The patient tolerated the procedure well and there were no known complications. ? ?Please note that a surgical assistant was a medical necessity for this procedure to perform it in a safe and expeditious manner. Assistant was necessary to provide appropriate retraction of vital neurovascular structures, to prevent femoral fracture, and to allow for anatomic placement of the prosthesis. ?

## 2021-09-23 NOTE — Discharge Instructions (Addendum)
? ?Dr. Brian Swinteck ?Joint Replacement Specialist ?[facility] ?[address] ?[place], [place] [address] ?[phone] ? ? ?TOTAL HIP REPLACEMENT POSTOPERATIVE DIRECTIONS ? ? ? ?Hip Rehabilitation, Guidelines Following Surgery  ? ?WEIGHT BEARING ?Weight bearing as tolerated with assist device (walker, cane, etc) as directed, use it as long as suggested by your surgeon or therapist, typically at least 4-6 weeks. ? ?The results of a hip operation are greatly improved after range of motion and muscle strengthening exercises. Follow all safety measures which are given to protect your hip. If any of these exercises cause increased pain or swelling in your joint, decrease the amount until you are comfortable again. Then slowly increase the exercises. Call your caregiver if you have problems or questions.  ? ?HOME CARE INSTRUCTIONS  ?Most of the following instructions are designed to prevent the dislocation of your new hip.  ?Remove items at home which could result in a fall. This includes throw rugs or furniture in walking pathways.  ?Continue medications as instructed at time of discharge. ?You may have some home medications which will be placed on hold until you complete the course of blood thinner medication. ?You may start showering once you are discharged home. Do not remove your dressing. ?Do not put on socks or shoes without following the instructions of your caregivers.   ?Sit on chairs with arms. Use the chair arms to help push yourself up when arising.  ?Arrange for the use of a toilet seat elevator so you are not sitting low.  ?Walk with walker as instructed.  ?You may resume a sexual relationship in one month or when given the OK by your caregiver.  ?Use walker as long as suggested by your caregivers.  ?You may put full weight on your legs and walk as much as is comfortable. ?Avoid periods of inactivity such as sitting longer than an hour when not asleep. This helps prevent blood  clots.  ?You may return to work once you are cleared by your surgeon.  ?Do not drive a car for 6 weeks or until released by your surgeon.  ?Do not drive while taking narcotics.  ?Wear elastic stockings for two weeks following surgery during the day but you may remove then at night.  ?Make sure you keep all of your appointments after your operation with all of your doctors and caregivers. You should call the office at the above phone number and make an appointment for approximately two weeks after the date of your surgery. ?Please pick up a stool softener and laxative for home use as long as you are requiring pain medications. ?ICE to the affected hip every three hours for 30 minutes at a time and then as needed for pain and swelling. Continue to use ice on the hip for pain and swelling from surgery. You may notice swelling that will progress down to the foot and ankle.  This is normal after surgery.  Elevate the leg when you are not up walking on it.   ?It is important for you to complete the blood thinner medication as prescribed by your doctor. ?Continue to use the breathing machine which will help keep your temperature down.  It is common for your temperature to cycle up and down following surgery, especially at night when you are not up moving around and exerting yourself.  The breathing machine keeps your lungs expanded and your temperature down. ? ?RANGE OF MOTION AND STRENGTHENING EXERCISES  ?These exercises are designed to help you   keep full movement of your hip joint. Follow your caregiver's or physical therapist's instructions. Perform all exercises about fifteen times, three times per day or as directed. Exercise both hips, even if you have had only one joint replacement. These exercises can be done on a training (exercise) mat, on the floor, on a table or on a bed. Use whatever works the best and is most comfortable for you. Use music or television while you are exercising so that the exercises are a  pleasant break in your day. This will make your life better with the exercises acting as a break in routine you can look forward to.  ?Lying on your back, slowly slide your foot toward your buttocks, raising your knee up off the floor. Then slowly slide your foot back down until your leg is straight again.  ?Lying on your back spread your legs as far apart as you can without causing discomfort.  ?Lying on your side, raise your upper leg and foot straight up from the floor as far as is comfortable. Slowly lower the leg and repeat.  ?Lying on your back, tighten up the muscle in the front of your thigh (quadriceps muscles). You can do this by keeping your leg straight and trying to raise your heel off the floor. This helps strengthen the largest muscle supporting your knee.  ?Lying on your back, tighten up the muscles of your buttocks both with the legs straight and with the knee bent at a comfortable angle while keeping your heel on the floor.  ? ?SKILLED REHAB INSTRUCTIONS: ?If the patient is transferred to a skilled rehab facility following release from the hospital, a list of the current medications will be sent to the facility for the patient to continue.  When discharged from the skilled rehab facility, please have the facility set up the patient's Home Health Physical Therapy prior to being released. Also, the skilled facility will be responsible for providing the patient with their medications at time of release from the facility to include their pain medication and their blood thinner medication. If the patient is still at the rehab facility at time of the two week follow up appointment, the skilled rehab facility will also need to assist the patient in arranging follow up appointment in our office and any transportation needs. ? ?POST-OPERATIVE OPIOID TAPER INSTRUCTIONS: ?It is important to wean off of your opioid medication as soon as possible. If you do not need pain medication after your surgery it is ok  to stop day one. ?Opioids include: ?Codeine, Hydrocodone(Norco, Vicodin), Oxycodone(Percocet, oxycontin) and hydromorphone amongst others.  ?Long term and even short term use of opiods can cause: ?Increased pain response ?Dependence ?Constipation ?Depression ?Respiratory depression ?And more.  ?Withdrawal symptoms can include ?Flu like symptoms ?Nausea, vomiting ?And more ?Techniques to manage these symptoms ?Hydrate well ?Eat regular healthy meals ?Stay active ?Use relaxation techniques(deep breathing, meditating, yoga) ?Do Not substitute Alcohol to help with tapering ?If you have been on opioids for less than two weeks and do not have pain than it is ok to stop all together.  ?Plan to wean off of opioids ?This plan should start within one week post op of your joint replacement. ?Maintain the same interval or time between taking each dose and first decrease the dose.  ?Cut the total daily intake of opioids by one tablet each day ?Next start to increase the time between doses. ?The last dose that should be eliminated is the evening dose.  ? ? ?MAKE   SURE YOU:  ?Understand these instructions.  ?Will watch your condition.  ?Will get help right away if you are not doing well or get worse. ? ?Pick up stool softner and laxative for home use following surgery while on pain medications. ?Do not remove your dressing. ?The dressing is waterproof--it is OK to take showers. ?Continue to use ice for pain and swelling after surgery. ?Elevate toes above nose. ?Do not use any lotions or creams on the incision until instructed by your surgeon. ?Please do 150 standing side leg raises every day for abductor strengthening.  ?Total Hip Protocol. ? ?

## 2021-09-23 NOTE — Interval H&P Note (Signed)
History and Physical Interval Note: ? ?09/23/2021 ?1:08 PM ? ?[name]  has presented today for surgery, with the diagnosis of Right hip osteoarthritis.  The various methods of treatment have been discussed with the patient and family. After consideration of risks, benefits and other options for treatment, the patient has consented to  Procedure(s): ?TOTAL HIP ARTHROPLASTY ANTERIOR APPROACH (Right) as a surgical intervention.  The patient's history has been reviewed, patient examined, no change in status, stable for surgery.  I have reviewed the patient's chart and labs.  Questions were answered to the patient's satisfaction.   ? ? ?Iline Oven Latosha Gaylord ? ? ?

## 2021-09-23 NOTE — Transfer of Care (Signed)
Immediate Anesthesia Transfer of Care Note ? ?Patient: [name] ? ?Procedure(s) Performed: TOTAL HIP ARTHROPLASTY ANTERIOR APPROACH (Right: Hip) ? ?Patient Location: PACU ? ?Anesthesia Type:General ? ?Level of Consciousness: drowsy and patient cooperative ? ?Airway & Oxygen Therapy: Patient Spontanous Breathing and Patient connected to face mask oxygen ? ?Post-op Assessment: Report given to RN and Post -op Vital signs reviewed and stable ? ?Post vital signs: Reviewed and stable ? ?Last Vitals:  ?Vitals Value Taken Time  ?BP 100/86 09/23/21 1738  ?Temp    ?Pulse 58 09/23/21 1740  ?Resp 14 09/23/21 1740  ?SpO2 99 % 09/23/21 1740  ?Vitals shown include unvalidated device data. ? ?Last Pain:  ?Vitals:  ? 09/23/21 1144  ?TempSrc: Oral  ?   ? ?  ? ?Complications: No notable events documented. ?

## 2021-09-23 NOTE — Progress Notes (Signed)
Pt has arrived to 1327 from PACU s/p right THA, anterior approach.  Report accepted from Laura,RN.  Pt is alert and oriented.  Room orientation completed with call bell placed at bedside.  Will continue to monitor pt. ?

## 2021-09-24 ENCOUNTER — Encounter (HOSPITAL_COMMUNITY): Payer: Self-pay | Admitting: Orthopedic Surgery

## 2021-09-24 DIAGNOSIS — I10 Essential (primary) hypertension: Secondary | ICD-10-CM | POA: Diagnosis not present

## 2021-09-24 DIAGNOSIS — Z951 Presence of aortocoronary bypass graft: Secondary | ICD-10-CM | POA: Diagnosis not present

## 2021-09-24 DIAGNOSIS — E7800 Pure hypercholesterolemia, unspecified: Secondary | ICD-10-CM | POA: Diagnosis not present

## 2021-09-24 DIAGNOSIS — M1611 Unilateral primary osteoarthritis, right hip: Secondary | ICD-10-CM | POA: Diagnosis not present

## 2021-09-24 DIAGNOSIS — I2510 Atherosclerotic heart disease of native coronary artery without angina pectoris: Secondary | ICD-10-CM | POA: Diagnosis not present

## 2021-09-24 LAB — BASIC METABOLIC PANEL
Anion gap: 10 (ref 5–15)
BUN: 26 mg/dL — ABNORMAL HIGH (ref 8–23)
CO2: 22 mmol/L (ref 22–32)
Calcium: 7.8 mg/dL — ABNORMAL LOW (ref 8.9–10.3)
Chloride: 106 mmol/L (ref 98–111)
Creatinine, Ser: 1.25 mg/dL — ABNORMAL HIGH (ref 0.44–1.00)
GFR, Estimated: 43 mL/min — ABNORMAL LOW (ref >60)
Glucose, Bld: 184 mg/dL — ABNORMAL HIGH (ref 70–99)
Potassium: 3.9 mmol/L (ref 3.5–5.1)
Sodium: 138 mmol/L (ref 135–145)

## 2021-09-24 LAB — CBC
HCT: 26.5 % — ABNORMAL LOW (ref 36.0–46.0)
Hemoglobin: 8.7 g/dL — ABNORMAL LOW (ref 12.0–15.0)
MCH: 30.6 pg (ref 26.0–34.0)
MCHC: 32.8 g/dL (ref 30.0–36.0)
MCV: 93.3 fL (ref 80.0–100.0)
Platelets: 221 10*3/uL (ref 150–400)
RBC: 2.84 MIL/uL — ABNORMAL LOW (ref 3.87–5.11)
RDW: 13.1 % (ref 11.5–15.5)
WBC: 18.4 10*3/uL — ABNORMAL HIGH (ref 4.0–10.5)
nRBC: 0 % (ref 0.0–0.2)

## 2021-09-24 MED ORDER — DOCUSATE SODIUM 100 MG PO CAPS: 100.0000 mg | ORAL_CAPSULE | Freq: Two times a day (BID) | ORAL | 1 refills | Status: AC

## 2021-09-24 MED ORDER — ASPIRIN 81 MG PO CHEW
81.0000 mg | CHEWABLE_TABLET | Freq: Two times a day (BID) | ORAL | 0 refills | Status: AC
Start: 2021-09-24 — End: 2021-11-08

## 2021-09-24 MED ORDER — POLYETHYLENE GLYCOL 3350 17 G PO PACK: 17.0000 g | PACK | Freq: Every day | ORAL | 0 refills | Status: DC | PRN

## 2021-09-24 MED ORDER — HYDROCODONE-ACETAMINOPHEN 5-325 MG PO TABS: 1.0000 | ORAL_TABLET | ORAL | 0 refills | Status: DC | PRN

## 2021-09-24 MED ORDER — ONDANSETRON HCL 4 MG PO TABS: 4.0000 mg | ORAL_TABLET | Freq: Four times a day (QID) | ORAL | 0 refills | Status: DC | PRN

## 2021-09-24 MED ORDER — SENNA 8.6 MG PO TABS: 2.0000 | ORAL_TABLET | Freq: Every day | ORAL | 1 refills | Status: AC

## 2021-09-24 NOTE — Plan of Care (Signed)
  Problem: Health Behavior/Discharge Planning: Goal: Ability to manage health-related needs will improve Outcome: Progressing   Problem: Clinical Measurements: Goal: Ability to maintain clinical measurements within normal limits will improve Outcome: Progressing   Problem: Activity: Goal: Risk for activity intolerance will decrease Outcome: Progressing   Problem: Pain Managment: Goal: General experience of comfort will improve Outcome: Progressing   Problem: Safety: Goal: Ability to remain free from injury will improve Outcome: Progressing   

## 2021-09-24 NOTE — Evaluation (Signed)
Physical Therapy Evaluation ?Patient Details ?Name: [name] ?MRN: [id number] ?DOB: 02-Feb-1935 ?Today's Date: 09/24/2021 ? ?History of Present Illness ? Pt s/p R THR and with hx of CAD and CABG  ?Clinical Impression ? Pt s/p R THR and presents with decreased R LE strength/ROM and post op pain limiting functional mobility.  Pt should progress to dc home with family assist. ?   ? ?Recommendations for follow up therapy are one component of a multi-disciplinary discharge planning process, led by the attending physician.  Recommendations may be updated based on patient status, additional functional criteria and insurance authorization. ? ?Follow Up Recommendations Follow physician's recommendations for discharge plan and follow up therapies ? ?  ?Assistance Recommended at Discharge Frequent or constant Supervision/Assistance  ?Patient can return home with the following ? A little help with walking and/or transfers;A little help with bathing/dressing/bathroom;Assistance with cooking/housework;Assist for transportation;Help with stairs or ramp for entrance ? ?  ?Equipment Recommendations Rolling walker (2 wheels) (youth level)  ?Recommendations for Other Services ?    ?  ?Functional Status Assessment Patient has had a recent decline in their functional status and demonstrates the ability to make significant improvements in function in a reasonable and predictable amount of time.  ? ?  ?Precautions / Restrictions Precautions ?Precautions: Fall ?Restrictions ?Weight Bearing Restrictions: No ?RLE Weight Bearing: Weight bearing as tolerated  ? ?  ? ?Mobility ? Bed Mobility ?Overal bed mobility: Needs Assistance ?Bed Mobility: Supine to Sit ?  ?  ?Supine to sit: Min guard ?  ?  ?General bed mobility comments: cues for sequence and use of L LE to self assist ?  ? ?Transfers ?Overall transfer level: Needs assistance ?Equipment used: Rolling walker (2 wheels) ?Transfers: Sit to/from Stand ?Sit to Stand: Min guard ?  ?  ?  ?  ?   ?General transfer comment: cues for LE management and use of UEs to self assist ?  ? ?Ambulation/Gait ?Ambulation/Gait assistance: Min assist, Min guard ?Gait Distance (Feet): 140 Feet ?Assistive device: Rolling walker (2 wheels) ?Gait Pattern/deviations: Step-to pattern, Step-through pattern, Decreased step length - right, Decreased step length - left, Shuffle, Trunk flexed ?Gait velocity: decr ?  ?  ?General Gait Details: cues for posture, position from RW and initial sequence ? ?Stairs ?  ?  ?  ?  ?  ? ?Wheelchair Mobility ?  ? ?Modified Rankin (Stroke Patients Only) ?  ? ?  ? ?Balance Overall balance assessment: Mild deficits observed, not formally tested ?  ?  ?  ?  ?  ?  ?  ?  ?  ?  ?  ?  ?  ?  ?  ?  ?  ?  ?   ? ? ? ?Pertinent Vitals/Pain Pain Assessment ?Pain Assessment: 0-10 ?Pain Score: 4  ?Pain Location: R hip with movement ?Pain Descriptors / Indicators: Aching, Sore ?Pain Intervention(s): Limited activity within patient's tolerance, Monitored during session (pt declines premed)  ? ? ?Home Living Family/patient expects to be discharged to:: Private residence ?Living Arrangements: Children ?Available Help at Discharge: Family;Available 24 hours/day ?Type of Home: House ?Home Access: Stairs to enter ?Entrance Stairs-Rails: None ?Entrance Stairs-Number of Steps: 2 ?  ?Home Layout: One level ?Home Equipment: None ?   ?  ?Prior Function Prior Level of Function : Independent/Modified Independent ?  ?  ?  ?  ?  ?  ?  ?  ?  ? ? ?Hand Dominance  ?   ? ?  ?Extremity/Trunk Assessment  ?  Upper Extremity Assessment ?Upper Extremity Assessment: Overall WFL for tasks assessed ?  ? ?Lower Extremity Assessment ?Lower Extremity Assessment: RLE deficits/detail ?RLE Deficits / Details: AAROM at hip to 90 flex and 15 abd with strength at hip 2+/5 ?  ? ?Cervical / Trunk Assessment ?Cervical / Trunk Assessment: Normal  ?Communication  ? Communication: No difficulties  ?Cognition Arousal/Alertness: Awake/alert ?Behavior During  Therapy: [facility] for tasks assessed/performed ?Overall Cognitive Status: Within Functional Limits for tasks assessed ?  ?  ?  ?  ?  ?  ?  ?  ?  ?  ?  ?  ?  ?  ?  ?  ?  ?  ?  ? ?  ?General Comments   ? ?  ?Exercises Total Joint Exercises ?Ankle Circles/Pumps: AROM, Both, 15 reps, Supine ?Quad Sets: AROM, Both, 10 reps, Supine ?Heel Slides: AAROM, Right, 20 reps, Supine ?Hip ABduction/ADduction: AAROM, Right, 15 reps, Supine ?Long Arc Quad: AROM, Right, 10 reps, Supine  ? ?Assessment/Plan  ?  ?PT Assessment Patient needs continued PT services  ?PT Problem List Decreased strength;Decreased range of motion;Decreased activity tolerance;Decreased balance;Decreased mobility;Decreased knowledge of use of DME;Pain ? ?   ?  ?PT Treatment Interventions DME instruction;Gait training;Stair training;Functional mobility training;Therapeutic activities;Therapeutic exercise;Patient/family education   ? ?PT Goals (Current goals can be found in the Care Plan section)  ?Acute Rehab PT Goals ?Patient Stated Goal: Regain IND ?PT Goal Formulation: With patient ?Time For Goal Achievement: 10/01/21 ?Potential to Achieve Goals: Good ? ?  ?Frequency 7X/week ?  ? ? ?Co-evaluation   ?  ?  ?  ?  ? ? ?  ?AM-PAC PT "6 Clicks" Mobility  ?Outcome Measure Help needed turning from your back to your side while in a flat bed without using bedrails?: A Little ?Help needed moving from lying on your back to sitting on the side of a flat bed without using bedrails?: A Little ?Help needed moving to and from a bed to a chair (including a wheelchair)?: A Little ?Help needed standing up from a chair using your arms (e.g., wheelchair or bedside chair)?: A Little ?Help needed to walk in hospital room?: A Little ?Help needed climbing 3-5 steps with a railing? : A Little ?6 Click Score: 18 ? ?  ?End of Session Equipment Utilized During Treatment: Gait belt ?Activity Tolerance: Patient tolerated treatment well ?Patient left: in chair;with call bell/phone within  reach ?Nurse Communication: Mobility status ?PT Visit Diagnosis: Unsteadiness on feet (R26.81);Difficulty in walking, not elsewhere classified (R26.2) ?  ? ?Time: 9562-1308 ?PT Time Calculation (min) (ACUTE ONLY): 34 min ? ? ?Charges:   PT Evaluation ?$PT Eval Low Complexity: 1 Low ?PT Treatments ?$Therapeutic Exercise: 8-22 mins ?  ?   ? ? ?Mauro Kaufmann PT ?Acute Rehabilitation Services ?Pager [phone] ?Office [phone] ? ? ?Alinah Sheard ?09/24/2021, 9:58 AM ? ?

## 2021-09-24 NOTE — Progress Notes (Signed)
Physical Therapy Treatment ?Patient Details ?Name: [name] ?MRN: [id number] ?DOB: 01/31/1935 ?Today's Date: 09/24/2021 ? ? ?History of Present Illness Pt s/p R THR and with hx of CAD and CABG ? ?  ?PT Comments  ? ? Pt motivated and progressing well with mobility.  Pt reviewed written HEP including progression, reviewed car transfers, ambulated limited distance in hall, and initiated stair training.  Pt with onset of N/V on stairs and assisted back down and to recliner to transport to room - RN alerted and assessing.  Will return to complete stair training.  Pt hopeful for dc this date.   ?Recommendations for follow up therapy are one component of a multi-disciplinary discharge planning process, led by the attending physician.  Recommendations may be updated based on patient status, additional functional criteria and insurance authorization. ? ?Follow Up Recommendations ? Follow physician's recommendations for discharge plan and follow up therapies ?  ?  ?Assistance Recommended at Discharge Frequent or constant Supervision/Assistance  ?Patient can return home with the following A little help with walking and/or transfers;A little help with bathing/dressing/bathroom;Assistance with cooking/housework;Assist for transportation;Help with stairs or ramp for entrance ?  ?Equipment Recommendations ? Rolling walker (2 wheels)  ?  ?Recommendations for Other Services   ? ? ?  ?Precautions / Restrictions Precautions ?Precautions: Fall ?Restrictions ?Weight Bearing Restrictions: No ?RLE Weight Bearing: Weight bearing as tolerated  ?  ? ?Mobility ? Bed Mobility ?Overal bed mobility: Needs Assistance ?Bed Mobility: Supine to Sit, Sit to Supine ?  ?  ?Supine to sit: Min guard, Supervision ?Sit to supine: Min guard, Supervision ?  ?General bed mobility comments: cues for sequence and use of L LE and gait belt to self assist ?  ? ?Transfers ?Overall transfer level: Needs assistance ?Equipment used: Rolling walker (2  wheels) ?Transfers: Sit to/from Stand ?Sit to Stand: Min guard, Supervision ?  ?  ?  ?  ?  ?General transfer comment: cues for LE management and use of UEs to self assist ?  ? ?Ambulation/Gait ?Ambulation/Gait assistance: Min guard ?Gait Distance (Feet): 100 Feet (and additional 20') ?Assistive device: Rolling walker (2 wheels) ?Gait Pattern/deviations: Step-to pattern, Step-through pattern, Decreased step length - right, Decreased step length - left, Shuffle, Trunk flexed ?Gait velocity: decr ?  ?  ?General Gait Details: cues for posture, position from RW and initial sequence ? ? ?Stairs ?Stairs: Yes ?Stairs assistance: Min assist ?Stair Management: No rails, Step to pattern, Backwards, Forwards ?Number of Stairs: 4 ?General stair comments: single step twice bkwd and 2 steps fwd with RW at top and use of "pull handle" ? ? ?Wheelchair Mobility ?  ? ?Modified Rankin (Stroke Patients Only) ?  ? ? ?  ?Balance Overall balance assessment: Mild deficits observed, not formally tested ?  ?  ?  ?  ?  ?  ?  ?  ?  ?  ?  ?  ?  ?  ?  ?  ?  ?  ?  ? ?  ?Cognition Arousal/Alertness: Awake/alert ?Behavior During Therapy: [facility] for tasks assessed/performed ?Overall Cognitive Status: Within Functional Limits for tasks assessed ?  ?  ?  ?  ?  ?  ?  ?  ?  ?  ?  ?  ?  ?  ?  ?  ?  ?  ?  ? ?  ?Exercises Total Joint Exercises ?Ankle Circles/Pumps: AROM, Both, 15 reps, Supine ?Quad Sets: AROM, Both, 10 reps, Supine ?Heel Slides: AAROM, Right, 20 reps, Supine ?  Hip ABduction/ADduction: AAROM, Right, 15 reps, Supine ?Long Arc Quad: AROM, Right, 10 reps, Supine ? ?  ?General Comments   ?  ?  ? ?Pertinent Vitals/Pain Pain Assessment ?Pain Assessment: 0-10 ?Pain Score: 3  ?Pain Location: R hip with movement ?Pain Descriptors / Indicators: Aching, Sore ?Pain Intervention(s): Limited activity within patient's tolerance, Monitored during session, Premedicated before session  ? ? ?Home Living Family/patient expects to be discharged to:: Private  residence ?Living Arrangements: Children ?Available Help at Discharge: Family;Available 24 hours/day ?Type of Home: House ?Home Access: Stairs to enter ?Entrance Stairs-Rails: None ?Entrance Stairs-Number of Steps: 2 ?  ?Home Layout: One level ?Home Equipment: None ?   ?  ?Prior Function    ?  ?  ?   ? ?PT Goals (current goals can now be found in the care plan section) Acute Rehab PT Goals ?Patient Stated Goal: Regain IND ?PT Goal Formulation: With patient ?Time For Goal Achievement: 10/01/21 ?Potential to Achieve Goals: Good ?Progress towards PT goals: Progressing toward goals ? ?  ?Frequency ? ? ? 7X/week ? ? ? ?  ?PT Plan Current plan remains appropriate  ? ? ?Co-evaluation   ?  ?  ?  ?  ? ?  ?AM-PAC PT "6 Clicks" Mobility   ?Outcome Measure ? Help needed turning from your back to your side while in a flat bed without using bedrails?: A Little ?Help needed moving from lying on your back to sitting on the side of a flat bed without using bedrails?: A Little ?Help needed moving to and from a bed to a chair (including a wheelchair)?: A Little ?Help needed standing up from a chair using your arms (e.g., wheelchair or bedside chair)?: A Little ?Help needed to walk in hospital room?: A Little ?Help needed climbing 3-5 steps with a railing? : A Little ?6 Click Score: 18 ? ?  ?End of Session Equipment Utilized During Treatment: Gait belt ?Activity Tolerance: Patient tolerated treatment well ?Patient left: with call bell/phone within reach;in bed ?Nurse Communication: Other (comment) (N&V) ?PT Visit Diagnosis: Unsteadiness on feet (R26.81);Difficulty in walking, not elsewhere classified (R26.2) ?  ? ? ?Time: 3382-5053 ?PT Time Calculation (min) (ACUTE ONLY): 24 min ? ?Charges:  $Gait Training: 8-22 mins ?$Therapeutic Exercise: 8-22 mins ?$Therapeutic Activity: 8-22 mins          ?          ? ?Mauro Kaufmann PT ?Acute Rehabilitation Services ?Pager [phone] ?Office [phone] ? ? ? ?Gianlucca Szymborski ?09/24/2021, 11:26  AM ? ?

## 2021-09-24 NOTE — TOC Transition Note (Signed)
Transition of Care (TOC) - CM/SW Discharge Note ? ? ?Patient Details  ?Name: [name] ?MRN: [id number] ?Date of Birth: [birth year] ? ?Transition of Care (TOC) CM/SW Contact:  ?Kai Calico, LCSW ?Phone Number: ?09/24/2021, 10:10 AM ? ? ?Clinical Narrative:    ? ?Met with pt and confirmed receipt of youth rw via [facility]. Plan for HEP.  No further TOC needs. ? ?Final next level of care: Home/Self Care ?Barriers to Discharge: No Barriers Identified ? ? ?Patient Goals and CMS Choice ?Patient states their goals for this hospitalization and ongoing recovery are:: return home ?  ?  ? ?Discharge Placement ?  ?           ?  ?  ?  ?  ? ?Discharge Plan and Services ?  ?  ?           ?DME Arranged: [facility] ?DME Agency: [employer] ?  ?  ?  ?  ?  ?  ?  ?  ? ?Social Determinants of Health (SDOH) Interventions ?  ? ? ?Readmission Risk Interventions ?   ? View : No data to display.  ?  ?  ?  ? ? ? ? ? ?

## 2021-09-24 NOTE — Discharge Summary (Signed)
Physician Discharge Summary  ?Patient ID: ?Christine KaplanBetty H Thompson ?MRN: [id number] ?DOB/AGE: 85/01/1935 85 y.o. ? ?Admit date: 09/23/2021 ?Discharge date: 09/24/2021 ? ?Admission Diagnoses:  ?Primary osteoarthritis of right hip ? ?Discharge Diagnoses:  ?Principal Problem: ?  Primary osteoarthritis of right hip ?Active Problems: ?  Osteoarthritis of right hip ? ? ?Past Medical History:  ?Diagnosis Date  ? Arthritis   ? Cataract   ? Coronary artery disease   ? Hypercholesterolemia   ? Hypertension   ? ESSENTIAL  ? Myalgia   ? Nausea   ? ? ?Surgeries: Procedure(s): ?TOTAL HIP ARTHROPLASTY ANTERIOR APPROACH on 09/23/2021 ?  ?Consultants (if any):  ? ?Discharged Condition: Improved ? ?Hospital Course: [name] is an 87 y.o. female who was admitted 09/23/2021 with a diagnosis of Primary osteoarthritis of right hip and went to the operating room on 09/23/2021 and underwent the above named procedures.   ? ?She was given perioperative antibiotics:  ?Anti-infectives (From admission, onward)  ? ? Start     Dose/Rate Route Frequency Ordered Stop  ? 09/23/21 2100  ceFAZolin (ANCEF) IVPB 2g/100 mL premix       ? 2 g ?200 mL/hr over 30 Minutes Intravenous Every 6 hours 09/23/21 1823 09/24/21 0432  ? 09/23/21 1145  ceFAZolin (ANCEF) IVPB 2g/100 mL premix       ? 2 g ?200 mL/hr over 30 Minutes Intravenous On call to O.R. 09/23/21 1133 09/23/21 1503  ? ?  ? ? ?She was given sequential compression devices, early ambulation, and aspirin for DVT prophylaxis. ? ?She benefited maximally from the hospital stay and there were no complications.   ? ?Recent vital signs:  ?Vitals:  ? 09/24/21 0432 09/24/21 0738  ?BP: 126/66 (!) 110/57  ?Pulse: (!) 54 (!) 59  ?Resp: 17 15  ?Temp: 97.7 ?F (36.5 ?C)   ?SpO2: 94% 100%  ? ? ?Recent laboratory studies:  ?Lab Results  ?Component Value Date  ? HGB 8.7 (L) 09/24/2021  ? HGB 14.3 07/06/2015  ? HGB 11.0 (L) 07/30/2007  ? ?Lab Results  ?Component Value Date  ? WBC 18.4 (H) 09/24/2021  ? PLT 221 09/24/2021  ? ?Lab  Results  ?Component Value Date  ? INR 3.0 (H) 08/01/2007  ? ?Lab Results  ?Component Value Date  ? NA 138 09/24/2021  ? K 3.9 09/24/2021  ? CL 106 09/24/2021  ? CO2 22 09/24/2021  ? BUN 26 (H) 09/24/2021  ? CREATININE 1.25 (H) 09/24/2021  ? GLUCOSE 184 (H) 09/24/2021  ? ? ? ?Allergies as of 09/24/2021   ? ?   Reactions  ? Crestor [rosuvastatin Calcium]   ? Leg pain  ? Ramipril Cough  ? Simvastatin   ? Weak hoarse voice  ? Sulfa Antibiotics Nausea Only  ? Zetia [ezetimibe] Other (See Comments)  ? Muscle pain  ? Amiodarone Rash  ? ?  ? ?  ?Medication List  ?  ? ?STOP taking these medications   ? ?aspirin 81 MG tablet ?Replaced by: aspirin 81 MG chewable tablet ?  ? ?  ? ?TAKE these medications   ? ?aspirin 81 MG chewable tablet ?Chew 1 tablet (81 mg total) by mouth 2 (two) times daily. ?Replaces: aspirin 81 MG tablet ?  ?Co Q 10 60 MG Caps ?Take 1 capsule by mouth daily. ?  ?docusate sodium 100 MG capsule ?Commonly known as: Colace ?Take 1 capsule (100 mg total) by mouth 2 (two) times daily. ?  ?HYDROcodone-acetaminophen 5-325 MG tablet ?Commonly known as: NORCO/VICODIN ?Take  1 tablet by mouth every 4 (four) hours as needed for moderate pain (pain score 4-6). ?  ?losartan-hydrochlorothiazide 100-12.5 MG tablet ?Commonly known as: HYZAAR ?Take 1 tablet by mouth once daily ?  ?metoprolol tartrate 25 MG tablet ?Commonly known as: LOPRESSOR ?Take 1 tablet by mouth twice daily ?  ?multivitamin tablet ?Take 1 tablet by mouth daily. ?  ?Omega 3 1000 MG Caps ?Take 1,000 mg by mouth in the morning and at bedtime. ?  ?ondansetron 4 MG tablet ?Commonly known as: ZOFRAN ?Take 1 tablet (4 mg total) by mouth every 6 (six) hours as needed for nausea. ?  ?polyethylene glycol 17 g packet ?Commonly known as: MIRALAX / GLYCOLAX ?Take 17 g by mouth daily as needed for mild constipation. ?  ?senna 8.6 MG Tabs tablet ?Commonly known as: SENOKOT ?Take 2 tablets (17.2 mg total) by mouth at bedtime. ?  ?simvastatin 40 MG tablet ?Commonly known  as: ZOCOR ?TAKE 1 TABLET BY MOUTH AT BEDTIME ?  ? ?  ?  ? ? ?WEIGHT BEARING  ? ?Weight bearing as tolerated with assist device (walker, cane, etc) as directed, use it as long as suggested by your surgeon or therapist, typically at least 4-6 weeks. ? ? ?EXERCISES ? ?Results after joint replacement surgery are often greatly improved when you follow the exercise, range of motion and muscle strengthening exercises prescribed by your doctor. Safety measures are also important to protect the joint from further injury. Any time any of these exercises cause you to have increased pain or swelling, decrease what you are doing until you are comfortable again and then slowly increase them. If you have problems or questions, call your caregiver or physical therapist for advice.  ? ?Rehabilitation is important following a joint replacement. After just a few days of immobilization, the muscles of the leg can become weakened and shrink (atrophy).  These exercises are designed to build up the tone and strength of the thigh and leg muscles and to improve motion. Often times heat used for twenty to thirty minutes before working out will loosen up your tissues and help with improving the range of motion but do not use heat for the first two weeks following surgery (sometimes heat can increase post-operative swelling).  ? ?These exercises can be done on a training (exercise) mat, on the floor, on a table or on a bed. Use whatever works the best and is most comfortable for you.    Use music or television while you are exercising so that the exercises are a pleasant break in your day. This will make your life better with the exercises acting as a break in your routine that you can look forward to.   Perform all exercises about fifteen times, three times per day or as directed.  You should exercise both the operative leg and the other leg as well. ? ?Exercises include: ?  ?Quad Sets - Tighten up the muscle on the front of the thigh (Quad)  and hold for 5-10 seconds.   ?Straight Leg Raises - With your knee straight (if you were given a brace, keep it on), lift the leg to 60 degrees, hold for 3 seconds, and slowly lower the leg.  Perform this exercise against resistance later as your leg gets stronger.  ?Leg Slides: Lying on your back, slowly slide your foot toward your buttocks, bending your knee up off the floor (only go as far as is comfortable). Then slowly slide your foot back down until your leg  is flat on the floor again.  ?Angel Wings: Lying on your back spread your legs to the side as far apart as you can without causing discomfort.  ?Hamstring Strength:  Lying on your back, push your heel against the floor with your leg straight by tightening up the muscles of your buttocks.  Repeat, but this time bend your knee to a comfortable angle, and push your heel against the floor.  You may put a pillow under the heel to make it more comfortable if necessary.  ? ?A rehabilitation program following joint replacement surgery can speed recovery and prevent re-injury in the future due to weakened muscles. Contact your doctor or a physical therapist for more information on knee rehabilitation.  ? ? ?CONSTIPATION ? ?Constipation is defined medically as fewer than three stools per week and severe constipation as less than one stool per week.  Even if you have a regular bowel pattern at home, your normal regimen is likely to be disrupted due to multiple reasons following surgery.  Combination of anesthesia, postoperative narcotics, change in appetite and fluid intake all can affect your bowels.  ? ?YOU MUST use at least one of the following options; they are listed in order of increasing strength to get the job done.  They are all available over the counter, and you may need to use some, POSSIBLY even all of these options:   ? ?Drink plenty of fluids (prune juice may be helpful) and high fiber foods ?Colace 100 mg by mouth twice a day  ?Senokot for  constipation as directed and as needed Dulcolax (bisacodyl), take with full glass of water  ?Miralax (polyethylene glycol) once or twice a day as needed. ? ?If you have tried all these things and are unable to have a

## 2021-09-24 NOTE — Progress Notes (Signed)
Physical Therapy Treatment ?Patient Details ?Name: [name] ?MRN: [id number] ?DOB: 11/30/1934 ?Today's Date: 09/24/2021 ? ? ?History of Present Illness Pt s/p R THR and with hx of CAD and CABG ? ?  ?PT Comments  ? ? Pt up to ambulate limited distance in hall and complete stair training.  No nausea or vomiting and pt eager for dc home this date.   ?Recommendations for follow up therapy are one component of a multi-disciplinary discharge planning process, led by the attending physician.  Recommendations may be updated based on patient status, additional functional criteria and insurance authorization. ? ?Follow Up Recommendations ? Follow physician's recommendations for discharge plan and follow up therapies ?  ?  ?Assistance Recommended at Discharge Frequent or constant Supervision/Assistance  ?Patient can return home with the following A little help with walking and/or transfers;A little help with bathing/dressing/bathroom;Assistance with cooking/housework;Assist for transportation;Help with stairs or ramp for entrance ?  ?Equipment Recommendations ? Rolling walker (2 wheels)  ?  ?Recommendations for Other Services   ? ? ?  ?Precautions / Restrictions Precautions ?Precautions: Fall ?Restrictions ?Weight Bearing Restrictions: No ?RLE Weight Bearing: Weight bearing as tolerated  ?  ? ?Mobility ? Bed Mobility ?Overal bed mobility: Needs Assistance ?Bed Mobility: Supine to Sit, Sit to Supine ?  ?  ?Supine to sit: Supervision ?Sit to supine: Supervision ?  ?General bed mobility comments: cues for sequence and use of L LE and gait belt to self assist ?  ? ?Transfers ?Overall transfer level: Needs assistance ?Equipment used: Rolling walker (2 wheels) ?Transfers: Sit to/from Stand ?Sit to Stand: Supervision ?  ?  ?  ?  ?  ?General transfer comment: cues for LE management and use of UEs to self assist ?  ? ?Ambulation/Gait ?Ambulation/Gait assistance: Min guard ?Gait Distance (Feet): 70 Feet ?Assistive device: Rolling  walker (2 wheels) ?Gait Pattern/deviations: Step-to pattern, Step-through pattern, Decreased step length - right, Decreased step length - left, Shuffle, Trunk flexed ?Gait velocity: decr ?  ?  ?General Gait Details: cues for posture, position from RW and initial sequence ? ? ?Stairs ?Stairs: Yes ?Stairs assistance: Min assist ?Stair Management: No rails, Step to pattern, Backwards, Forwards ?Number of Stairs: 3 ?General stair comments: single step bkwd and 2 steps fwd with RW at top and use of "pull handle" ? ? ?Wheelchair Mobility ?  ? ?Modified Rankin (Stroke Patients Only) ?  ? ? ?  ?Balance Overall balance assessment: Mild deficits observed, not formally tested ?  ?  ?  ?  ?  ?  ?  ?  ?  ?  ?  ?  ?  ?  ?  ?  ?  ?  ?  ? ?  ?Cognition Arousal/Alertness: Awake/alert ?Behavior During Therapy: [facility] for tasks assessed/performed ?Overall Cognitive Status: Within Functional Limits for tasks assessed ?  ?  ?  ?  ?  ?  ?  ?  ?  ?  ?  ?  ?  ?  ?  ?  ?  ?  ?  ? ?  ?Exercises Total Joint Exercises ?Ankle Circles/Pumps: AROM, Both, 15 reps, Supine ?Quad Sets: AROM, Both, 10 reps, Supine ?Heel Slides: AAROM, Right, 20 reps, Supine ?Hip ABduction/ADduction: AAROM, Right, 15 reps, Supine ?Long Arc Quad: AROM, Right, 10 reps, Supine ? ?  ?General Comments   ?  ?  ? ?Pertinent Vitals/Pain Pain Assessment ?Pain Assessment: 0-10 ?Pain Score: 3  ?Pain Location: R hip with movement ?Pain Descriptors / Indicators: Aching,  Sore ?Pain Intervention(s): Limited activity within patient's tolerance, Monitored during session, Premedicated before session, Ice applied  ? ? ?Home Living   ?  ?  ?  ?  ?  ?  ?  ?  ?  ?   ?  ?Prior Function    ?  ?  ?   ? ?PT Goals (current goals can now be found in the care plan section) Acute Rehab PT Goals ?Patient Stated Goal: Regain IND ?PT Goal Formulation: With patient ?Time For Goal Achievement: 10/01/21 ?Potential to Achieve Goals: Good ?Progress towards PT goals: Progressing toward goals ? ?   ?Frequency ? ? ? 7X/week ? ? ? ?  ?PT Plan Current plan remains appropriate  ? ? ?Co-evaluation   ?  ?  ?  ?  ? ?  ?AM-PAC PT "6 Clicks" Mobility   ?Outcome Measure ? Help needed turning from your back to your side while in a flat bed without using bedrails?: A Little ?Help needed moving from lying on your back to sitting on the side of a flat bed without using bedrails?: A Little ?Help needed moving to and from a bed to a chair (including a wheelchair)?: A Little ?Help needed standing up from a chair using your arms (e.g., wheelchair or bedside chair)?: A Little ?Help needed to walk in hospital room?: A Little ?Help needed climbing 3-5 steps with a railing? : A Little ?6 Click Score: 18 ? ?  ?End of Session Equipment Utilized During Treatment: Gait belt ?Activity Tolerance: Patient tolerated treatment well ?Patient left: with call bell/phone within reach;in bed ?Nurse Communication: Other (comment) ?PT Visit Diagnosis: Unsteadiness on feet (R26.81);Difficulty in walking, not elsewhere classified (R26.2) ?  ? ? ?Time: [id number] ?PT Time Calculation (min) (ACUTE ONLY): 12 min ? ?Charges:  $Gait Training: 8-22 mins ?$Therapeutic Activity: 8-22 mins          ?          ? ?Debe Coder PT ?Acute Rehabilitation Services ?Pager [phone] ?Office [phone] ? ? ? ?Hibba Schram ?09/24/2021, 2:32 PM ? ?

## 2021-09-24 NOTE — Progress Notes (Signed)
? ? ?  Subjective: ? ?Patient reports pain as mild to moderate. Patient states that she vomited this morning around 0400 when she got up to use the restroom. Currently denies N/V/CP/SOB/dizziness. Pain is currently controlled. No c/o. Patient was placed on O2 overnight per her RN that her stats were low. She does not usually use O2 at home.  ? ?Objective:  ? ?VITALS:   ?Vitals:  ? 09/23/21 2109 09/24/21 0102 09/24/21 3845 09/24/21 3646  ?BP: 107/67 108/65 126/66 (!) 110/57  ?Pulse: (!) 52 65 (!) 54 (!) 59  ?Resp: 18 16 17 15   ?Temp: (!) 96.7 ?F (35.9 ?C) 97.7 ?F (36.5 ?C) 97.7 ?F (36.5 ?C)   ?TempSrc:  Oral    ?SpO2: 100% 100% 94% 100%  ?Weight:      ?Height:      ? ?O2 was removed this morning and new SPO2 was taken with O2 at 100%. ? ?NAD ?Neurologically intact ?ABD soft ?Neurovascular intact ?Sensation intact distally ?Intact pulses distally ?Dorsiflexion/Plantar flexion intact ?Incision: dressing C/D/I ?No cellulitis present ?Compartment soft ?Bruising inferior to incision.  ? ? ? ?Lab Results  ?Component Value Date  ? WBC 18.4 (H) 09/24/2021  ? HGB 8.7 (L) 09/24/2021  ? HCT 26.5 (L) 09/24/2021  ? MCV 93.3 09/24/2021  ? PLT 221 09/24/2021  ? ?BMET ?   ?Component Value Date/Time  ? NA 138 09/24/2021 0314  ? NA 136 11/24/2017 0954  ? K 3.9 09/24/2021 0314  ? CL 106 09/24/2021 0314  ? CO2 22 09/24/2021 0314  ? GLUCOSE 184 (H) 09/24/2021 0314  ? BUN 26 (H) 09/24/2021 0314  ? BUN 28 (H) 11/24/2017 0954  ? CREATININE 1.25 (H) 09/24/2021 0314  ? CREATININE 0.91 09/27/2016 1054  ? CALCIUM 7.8 (L) 09/24/2021 0314  ? GFRNONAA 43 (L) 09/24/2021 0314  ? ?Labs reviewed. Hemoglogin 8.7. ? ?Assessment/Plan: ?1 Day Post-Op  ? ?Principal Problem: ?  Primary osteoarthritis of right hip ?Active Problems: ?  Osteoarthritis of right hip ? ? ?WBAT with walker ?DVT ppx: Aspirin, SCDs, TEDS ?Hemoglobin 8.7.  ?PO pain control.  ?PT/OT: Will come by this afternoon ?Dispo: D/c home with HEP once patient can eat without vomiting and cleared  by PT. ? ? ? ?11/24/2021 Jazzelle Zhang ?09/24/2021, 7:40 AM ? ? ?11/24/2021, MD ?[phone] ?[facility] is now [phone]  [place] ?[address], [address], [place], [place] ?Phone: [phone] ?[email or web address] ?Facebook  [phone]  ?  ? ? ?

## 2021-09-24 NOTE — Plan of Care (Signed)
Discharge instructions given to the patient including medications. ?

## 2021-10-08 ENCOUNTER — Telehealth: Payer: Self-pay | Admitting: Internal Medicine

## 2021-10-08 MED ORDER — METOPROLOL TARTRATE 25 MG PO TABS: 25.0000 mg | ORAL_TABLET | Freq: Two times a day (BID) | ORAL | 3 refills | Status: DC

## 2021-10-08 MED ORDER — LOSARTAN POTASSIUM 100 MG PO TABS: 100.0000 mg | ORAL_TABLET | Freq: Every day | ORAL | 3 refills | Status: DC

## 2021-10-08 NOTE — Telephone Encounter (Signed)
The patient has been made aware. Prescription has been sent in for her. She got batteries for her machine and will keep a log of her pressures.  ?

## 2021-10-08 NOTE — Telephone Encounter (Signed)
Ok to prescribe losartan 100 mg daily WITHOUT the HCTZ. ? ?Dr Rexene Edison ?

## 2021-10-08 NOTE — Telephone Encounter (Signed)
Pt c/o medication issue: ? ?1. Name of Medication: losartan-hydrochlorothiazide (HYZAAR) 100-12.5 MG tablet ? ?2. How are you currently taking this medication (dosage and times per day)? 1 tablet daily ? ?3. Are you having a reaction (difficulty breathing--STAT)? no ? ?4. What is your medication issue? Patient states she thinks her BP is too low from the medication. She says she had trouble walking due to feeling so weak and dizzy so a couple days ago she stopped taking it. She says she feels much better now and would like to know if she can take the losartan without the hydrochlorothiazide.  ?

## 2021-10-08 NOTE — Telephone Encounter (Signed)
Returned the call to the patient. She stated that after she had her recent surgery, she has felt weak and fatigued. She felt like it was because her blood pressure was too low. She stated that she was having low blood pressures while in the hospital. She has stopped the Losartan-HCTZ for the past several days and stated that she now feels better. She would like to know if she can stop the medication all together or just take the Losartan alone like she had previously.  ? ?She is unable to check her blood pressure at home due to needing batteries for her blood pressure machine. She has been advised to get batteries for the machine so that she can keep a log of her blood pressures due to it being hard to make a judgment of her blood pressure without actual readings. She reiterated that she has been feeling better while off of the Losartan-HCTZ. ?

## 2021-10-11 DIAGNOSIS — Z471 Aftercare following joint replacement surgery: Secondary | ICD-10-CM | POA: Diagnosis not present

## 2021-10-11 DIAGNOSIS — Z96641 Presence of right artificial hip joint: Secondary | ICD-10-CM | POA: Diagnosis not present

## 2021-10-11 DIAGNOSIS — Z4789 Encounter for other orthopedic aftercare: Secondary | ICD-10-CM | POA: Diagnosis not present

## 2021-11-09 DIAGNOSIS — Z4789 Encounter for other orthopedic aftercare: Secondary | ICD-10-CM | POA: Diagnosis not present

## 2021-12-27 ENCOUNTER — Other Ambulatory Visit: Payer: Self-pay

## 2021-12-27 ENCOUNTER — Telehealth: Payer: Self-pay | Admitting: Internal Medicine

## 2021-12-27 MED ORDER — LOSARTAN POTASSIUM-HCTZ 100-12.5 MG PO TABS: 1.0000 | ORAL_TABLET | Freq: Every day | ORAL | 3 refills | Status: DC

## 2021-12-27 NOTE — Telephone Encounter (Signed)
Pt c/o BP issue: STAT if pt c/o blurred vision, one-sided weakness or slurred speech  1. What are your last 5 BP readings? 144/80, 153/104, 160/99, 157/108  2. Are you having any other symptoms (ex. Dizziness, headache, blurred vision, passed out)? No, she states she feels weak.   3. What is your BP issue? Patient states her BP has been running high, she states her medication needs to be changed.

## 2021-12-27 NOTE — Telephone Encounter (Signed)
Patient reports her BP has been edging upward 144/80 to 160/99. She is asking to go back on Hyzaar 100-12.5. Dr. Royann Shivers advised and ordered Hyzaar 100-12.5 daily. Patient advised. Prescription sent to her preferred pharmacy. Patient stated she will monitor BP and contact clinic with results. Losartan 100mg  daily discontinued.

## 2022-03-11 ENCOUNTER — Other Ambulatory Visit: Payer: Self-pay | Admitting: Obstetrics and Gynecology

## 2022-03-11 DIAGNOSIS — Z1231 Encounter for screening mammogram for malignant neoplasm of breast: Secondary | ICD-10-CM

## 2022-03-17 DIAGNOSIS — H524 Presbyopia: Secondary | ICD-10-CM | POA: Diagnosis not present

## 2022-03-17 DIAGNOSIS — Z961 Presence of intraocular lens: Secondary | ICD-10-CM | POA: Diagnosis not present

## 2022-03-17 DIAGNOSIS — H5211 Myopia, right eye: Secondary | ICD-10-CM | POA: Diagnosis not present

## 2022-03-17 DIAGNOSIS — H52201 Unspecified astigmatism, right eye: Secondary | ICD-10-CM | POA: Diagnosis not present

## 2022-03-28 DIAGNOSIS — Z96641 Presence of right artificial hip joint: Secondary | ICD-10-CM | POA: Diagnosis not present

## 2022-04-14 DIAGNOSIS — M25512 Pain in left shoulder: Secondary | ICD-10-CM | POA: Diagnosis not present

## 2022-04-20 ENCOUNTER — Ambulatory Visit
Admission: RE | Admit: 2022-04-20 | Discharge: 2022-04-20 | Disposition: A | Discharge status: Home / Self Care | Payer: Medicare HMO | Source: Ambulatory Visit | Attending: Obstetrics and Gynecology | Admitting: Obstetrics and Gynecology

## 2022-04-20 DIAGNOSIS — Z1231 Encounter for screening mammogram for malignant neoplasm of breast: Secondary | ICD-10-CM | POA: Diagnosis not present

## 2022-06-15 DIAGNOSIS — Z6823 Body mass index (BMI) 23.0-23.9, adult: Secondary | ICD-10-CM | POA: Diagnosis not present

## 2022-06-15 DIAGNOSIS — Z01419 Encounter for gynecological examination (general) (routine) without abnormal findings: Secondary | ICD-10-CM | POA: Diagnosis not present

## 2022-06-15 DIAGNOSIS — M816 Localized osteoporosis [Lequesne]: Secondary | ICD-10-CM | POA: Diagnosis not present

## 2022-07-29 ENCOUNTER — Other Ambulatory Visit: Payer: Self-pay | Admitting: Internal Medicine

## 2022-07-29 DIAGNOSIS — E785 Hyperlipidemia, unspecified: Secondary | ICD-10-CM

## 2022-08-11 DIAGNOSIS — L720 Epidermal cyst: Secondary | ICD-10-CM | POA: Diagnosis not present

## 2022-09-20 DIAGNOSIS — E785 Hyperlipidemia, unspecified: Secondary | ICD-10-CM | POA: Diagnosis not present

## 2022-09-21 LAB — LIPID PANEL
Chol/HDL Ratio: 3.5 ratio (ref 0.0–4.4)
Cholesterol, Total: 204 mg/dL — ABNORMAL HIGH (ref 100–199)
HDL: 58 mg/dL (ref >39)
LDL Chol Calc (NIH): 130 mg/dL — ABNORMAL HIGH (ref 0–99)
Triglycerides: 88 mg/dL (ref 0–149)
VLDL Cholesterol Cal: 16 mg/dL (ref 5–40)

## 2022-09-27 ENCOUNTER — Telehealth: Payer: Self-pay | Admitting: Internal Medicine

## 2022-09-27 ENCOUNTER — Ambulatory Visit (HOSPITAL_BASED_OUTPATIENT_CLINIC_OR_DEPARTMENT_OTHER): Payer: Medicare HMO | Admitting: Internal Medicine

## 2022-09-27 ENCOUNTER — Encounter (HOSPITAL_BASED_OUTPATIENT_CLINIC_OR_DEPARTMENT_OTHER): Payer: Self-pay | Admitting: Internal Medicine

## 2022-09-27 VITALS — BP 106/74 | HR 72 | Ht 61.0 in | Wt 124.0 lb

## 2022-09-27 DIAGNOSIS — E785 Hyperlipidemia, unspecified: Secondary | ICD-10-CM | POA: Diagnosis not present

## 2022-09-27 DIAGNOSIS — T466X5D Adverse effect of antihyperlipidemic and antiarteriosclerotic drugs, subsequent encounter: Secondary | ICD-10-CM | POA: Diagnosis not present

## 2022-09-27 DIAGNOSIS — Z951 Presence of aortocoronary bypass graft: Secondary | ICD-10-CM

## 2022-09-27 DIAGNOSIS — M791 Myalgia, unspecified site: Secondary | ICD-10-CM

## 2022-09-27 MED ORDER — REPATHA SURECLICK 140 MG/ML ~~LOC~~ SOAJ: 1.0000 mL | SUBCUTANEOUS | 3 refills | Status: DC

## 2022-09-27 MED ORDER — LOSARTAN POTASSIUM 100 MG PO TABS: 100.0000 mg | ORAL_TABLET | Freq: Every day | ORAL | 3 refills | Status: DC

## 2022-09-27 NOTE — Telephone Encounter (Signed)
PA submitted for Repatha Sureclick 140mg /mL  This approval authorizes your coverage from 06/20/2022 - 06/20/2023   Patient called w/update Rx sent to [facility]

## 2022-09-27 NOTE — Progress Notes (Signed)
OFFICE NOTE  Chief Complaint:  Follow-up  Primary Care Physician: [facility] [place]  HPI:  [name] is a 88 y.o. female who was a former patient of Dr. Patty Sermons. She is followed for a history of hypertension and dyslipidemia. She also has a history of coronary artery disease and is status post coronary artery bypass grafting in 2009 x 4 by Dr. Laneta Simmers (LIMA to LAD, sequential SVG to the first diagonal and second and third obtuse marginal branches of the left circumflex, and SVG to the posterior descending branch of the right coronary). She is generally been asymptomatic since that time. She has not undergone any routine stress testing or echocardiography. Blood pressures been well controlled. Cholesterol is at goal on low-dose Zocor.  09/30/2016  [name] was seen today in follow-up. She seems to be doing well without any new complaints. Blood pressure was excellent today 132/70. She says that she's had intolerance of statins in the past. She reports myalgias with Lipitor and to a lesser extent Crestor and she developed some hoarseness in her voice with simvastatin. She has been able to tolerate 10 mg a simvastatin. We recently repeated a lipid profile showed total cholesterol 172, HDL 62, LDL 88 and triglycerides 110. I initially advised her to increase for simvastatin however she is little hesitant to do that. We had a nice discussion about that in the office today. Apparently she's been on Zetia in the past with even worse symptoms of muscle pain than with her Lipitor. She's also previously tried fish oil and niacin. We discussed the possibility of asymptomatic stress testing because of her bypass grafts being quite old at this point however she declined at this time.  11/10/2017  [name] was seen today in follow-up.  She is done well over the past year.  She is now 10 years out from coronary artery bypass grafting in 2009.  She denies chest pain or  worsening shortness of breath.  She has not had interval assessment of her bypass grafts with stress testing.  She had a well-controlled lipid profile on simvastatin, however discontinued this due to side effects including vocal hoarseness.  She was switched to rosuvastatin which time she developed leg pain both on the 5 and 10 mg doses.  She is previously been on ezetimibe and developed muscle pain.  Based on this she is deemed statin intolerant.  She does have ASCVD with four-vessel bypass in 2009 and has a goal LDL less than 70.  She is not likely to achieve that goal without additional therapy.  Unfortunately, she just had a repeat lipid profile which showed total cholesterol 231, triglycerides 131, HDL 57 and LDL 148 (off of therapy).  Based on this, I think she is a good candidate for PCSK9 inhibitor therapy.  We discussed this today and will pursue Repatha.  03/06/2018  [name] is seen today in follow-up.  She was felt to be a good candidate for PCSK9 and therapy and has started on Repatha.  I am pleased to report marked improvement in her dyslipidemia.  Her total cholesterol is decreased from 2 31-1 49.  Triglycerides have decreased from 131-1 14, HDL is increased from 54-57 and LDL cholesterol has decreased from 148-72.  She is tolerating the medication without any significant side effects.  This puts her slightly above goal LDL less than 70.  02/13/2019  [name] returns today for follow-up.  She denies any more chest pain or worsening shortness  of breath.  I have been working with her on coronary disease as well as dyslipidemia and had recommended PCSK9 inhibitor therapy.  Fortunately she had significant reduction in LDL cholesterol down to 72 on treatment and had been doing well without side effects.  She returns now 6 months later.  Overall she feels well.  Her blood pressures been well controlled.  We had discussed with the addition of HCTZ that ultimately we may combine her losartan with  the HCTZ for ease of administration.  She is due for repeat lipids.  04/30/2020  [name] seen today in follow-up.  Overall she is doing well from a cardiac standpoint.  She denies any chest pain or worsening shortness of breath.  Unfortunately she recently stopped using the PCSK9 hitters.  She said she was "tired of giving herself injections".  She was agreeable to go back on a statin medication which she said in the past that caused her a hoarse voice, namely simvastatin.  I believe she was on 40 mg previously but we started 20 mg daily.  She has been on that for several months since May 2021 and repeat lipids in August showed total cholesterol 164, HDL 56, LDL 90 and triglycerides 100.  Overall pretty good control however her target is less than 70 for LDL.  07/27/2021  [name] returns today for follow-up.  Overall she is doing well.  Her cholesterol is further improved, now demonstrating total cholesterol 142, triglycerides 100, HDL 54 and LDL 70, improved from 90 previously.  Although she is doing well with her lipids, she has been a little concerned about some abnormalities in her voice.  She feels that the higher dose of simvastatin has been associated with vocal hoarseness.  In the past she says she has had issues with this but seems to improve with lower doses.  This is unusual and have not seen it with other patients.  I think it would be helpful though to get an ENT opinion about it.  09/27/2022  [name] is seen today in follow-up.  Overall she is feeling well although has had some scratchiness in her throat which she thinks is related to allergies.  After stopping her simvastatin she says she has had some improvement in her vocal hoarseness however this is still an issue.  I think this is really related to an underlying cause.  I advised ENT but she is now considering an allergy evaluation.  After stopping her simvastatin her cholesterol is gone up.  Total cholesterol now 204,  triglycerides 88, HDL 58 and LDL 130.  Her target LDL is less than 70 which she had previously achieved.  She could also not tolerate rosuvastatin and ezetimibe.  In addition her blood pressure is running on the low normal side.  We have previously discussed stopping the HCTZ because of this and she has been on and off of that.  At times her blood pressure apparently runs high.  PMHx:  Past Medical History:  Diagnosis Date   Arthritis    Cataract    Coronary artery disease    Hypercholesterolemia    Hypertension    ESSENTIAL   Myalgia    Nausea     Past Surgical History:  Procedure Laterality Date   CORONARY ARTERY BYPASS GRAFT     TOTAL HIP ARTHROPLASTY Right 09/23/2021   Procedure: TOTAL HIP ARTHROPLASTY ANTERIOR APPROACH;  Surgeon: Samson FredericSwinteck, Brian, MD;  Location: [facility];  Service: Orthopedics;  Laterality: Right;  FAMHx:  Family History  Problem Relation Age of Onset   Stroke Mother 45   Cancer Father        lung cancer   Uterine cancer Sister 63   Dementia Sister    Dementia Sister    Hypertension Sister    Stroke Sister 58   Heart disease Sister 25    SOCHx:   reports that she has never smoked. She has never used smokeless tobacco. She reports that she does not drink alcohol and does not use drugs.  ALLERGIES:  Allergies  Allergen Reactions   Crestor [Rosuvastatin Calcium]     Leg pain   Ramipril Cough   Simvastatin     Weak hoarse voice   Sulfa Antibiotics Nausea Only   Zetia [Ezetimibe] Other (See Comments)    Muscle pain   Amiodarone Rash    ROS: Pertinent items noted in HPI and remainder of comprehensive ROS otherwise negative.  HOME MEDS: Current Outpatient Medications  Medication Sig Dispense Refill   Coenzyme Q10 (CO Q 10) 60 MG CAPS Take 1 capsule by mouth daily.     HYDROcodone-acetaminophen (NORCO/VICODIN) 5-325 MG tablet Take 1 tablet by mouth every 4 (four) hours as needed for moderate pain (pain score 4-6). 30 tablet 0    losartan-hydrochlorothiazide (HYZAAR) 100-12.5 MG tablet Take 1 tablet by mouth daily. 90 tablet 3   metoprolol tartrate (LOPRESSOR) 25 MG tablet Take 1 tablet (25 mg total) by mouth 2 (two) times daily. 180 tablet 3   Multiple Vitamin (MULTIVITAMIN) tablet Take 1 tablet by mouth daily.       Omega 3 1000 MG CAPS Take 1,000 mg by mouth in the morning and at bedtime.     ondansetron (ZOFRAN) 4 MG tablet Take 1 tablet (4 mg total) by mouth every 6 (six) hours as needed for nausea. 20 tablet 0   polyethylene glycol (MIRALAX / GLYCOLAX) 17 g packet Take 17 g by mouth daily as needed for mild constipation. 14 each 0   simvastatin (ZOCOR) 40 MG tablet TAKE 1 TABLET BY MOUTH AT BEDTIME (Patient not taking: Reported on 09/27/2022) 90 tablet 3   No current facility-administered medications for this visit.    LABS/IMAGING: No results found for this or any previous visit (from the past 48 hour(s)). No results found.  WEIGHTS: Wt Readings from Last 3 Encounters:  09/27/22 124 lb (56.2 kg)  09/23/21 130 lb (59 kg)  09/16/21 130 lb (59 kg)    VITALS: BP 106/74   Pulse 72   Ht 5\' 1"  (1.549 m)   Wt 124 lb (56.2 kg)   SpO2 100%   BMI 23.43 kg/m   EXAM: Deferred  EKG: Deferred  ASSESSMENT: CAD status post four-vessel CABG (2009) Dyslipidemia Hypertension Statin and ezetimibe intolerant  PLAN: 1.   Mrs. Mcgourty has somewhat of a labile hypertension but at this point has been running a low blood pressure/low normal blood pressure.  I think we can stop the HCTZ and just treat her with losartan monotherapy.  Her cholesterol is above target.  I discussed PCSK9 inhibitor with her today and she seemed to be somewhat agreeable to this.  I think it is much less likely to cause her significant side effects and should get her to target LDL less than 70.  Will reach out for prior authorization for this.  She might also qualify for a health well grant.  Plan to repeat lipid NMR in about 4 months and  LP(a).  Lisette Abu  Rennis Golden, MD, [facility], FACP  [name]  [facility]  Medical Director of the [facility] Lipid [facility] &  Cardiovascular Risk Reduction Clinic Diplomate of the American Board of Clinical Lipidology Attending Cardiologist  Direct Dial: [phone]  Fax: [phone]  Website:  [email or web address] Nicely Kathlyne Loud 09/27/2022, 9:02 AM

## 2022-09-27 NOTE — Patient Instructions (Signed)
Medication Instructions:  STOP losartan-hctz  START losartan 100mg  daily  Dr. Rennis Golden recommends Repatha Sureclick 140mg /mL or Praluent 150mg /mL (PCSK9). This is an injectable cholesterol medication self-administered once every 14 days. This medication will likely need prior approval with your insurance company, which we will work on. If the medication is not approved initially, we may need to do an appeal with your insurance.   Administer medication in area of fatty tissue such as abdomen, outer thigh, back of upper arm - and rotate site with each injection Store medication in refrigerator until ready to administer - allow to sit at room temp for 30 mins - 1 hour prior to injection Dispose of medication in a SHARPS container - your pharmacy should be able to direct you on this and proper disposal   If you need a co-pay card for Repatha: Lawsponsor.fr If you need a co-pay card for Praluent: [email or web address]  Patient Assistance:    These foundations have funds at various times.   The PAN Foundation: https://www.panfoundation.org/disease-funds/hypercholesterolemia/ -- can sign up for wait list  The [facility] offers assistance to help pay for medication copays.  They will cover copays for all cholesterol lowering meds, including statins, fibrates, omega-3 fish oils like Vascepa, ezetimibe, Repatha, Praluent, Nexletol, Nexlizet.  The cards are usually good for $2,500 or 12 months, whichever comes first. Go to healthwellfoundation.org Click on "Apply Now" Answer questions as to whom is applying (patient or representative) Your disease fund will be "hypercholesterolemia - Medicare access" They will ask questions about finances and which medications you are taking for cholesterol When you submit, the approval is usually within minutes.  You will need to print the card information from the site You will need to show this information to  your pharmacy, they will bill your Medicare Part D plan first -then bill Health Well --for the copay.   You can also call them at [phone], although the hold times can be quite long.      *If you need a refill on your cardiac medications before your next appointment, please call your pharmacy*   Lab Work: FASTING lab work to check cholesterol in 3-4 months  If you have labs (blood work) drawn today and your tests are completely normal, you will receive your results only by: MyChart Message (if you have MyChart) OR A paper copy in the mail If you have any lab test that is abnormal or we need to change your treatment, we will call you to review the results.    Follow-Up: At [facility], you and your health needs are our priority.  As part of our continuing mission to provide you with exceptional heart care, we have created designated Provider Care Teams.  These Care Teams include your primary Cardiologist (physician) and Advanced Practice Providers (APPs -  Physician Assistants and Nurse Practitioners) who all work together to provide you with the care you need, when you need it.  We recommend signing up for the patient portal called "MyChart".  Sign up information is provided on this After Visit Summary.  MyChart is used to connect with patients for Virtual Visits (Telemedicine).  Patients are able to view lab/test results, encounter notes, upcoming appointments, etc.  Non-urgent messages can be sent to your provider as well.   To learn more about what you can do with MyChart, go to [email or web address].    Your next appointment:    3-4 months with Dr. Rennis Golden

## 2022-10-04 DIAGNOSIS — Z471 Aftercare following joint replacement surgery: Secondary | ICD-10-CM | POA: Diagnosis not present

## 2022-10-04 DIAGNOSIS — Z96641 Presence of right artificial hip joint: Secondary | ICD-10-CM | POA: Diagnosis not present

## 2022-11-09 DIAGNOSIS — L820 Inflamed seborrheic keratosis: Secondary | ICD-10-CM | POA: Diagnosis not present

## 2022-11-09 DIAGNOSIS — L570 Actinic keratosis: Secondary | ICD-10-CM | POA: Diagnosis not present

## 2022-11-09 DIAGNOSIS — L720 Epidermal cyst: Secondary | ICD-10-CM | POA: Diagnosis not present

## 2022-11-09 DIAGNOSIS — L821 Other seborrheic keratosis: Secondary | ICD-10-CM | POA: Diagnosis not present

## 2022-11-09 DIAGNOSIS — L814 Other melanin hyperpigmentation: Secondary | ICD-10-CM | POA: Diagnosis not present

## 2022-12-13 ENCOUNTER — Other Ambulatory Visit: Payer: Self-pay | Admitting: Internal Medicine

## 2023-01-27 ENCOUNTER — Ambulatory Visit (HOSPITAL_BASED_OUTPATIENT_CLINIC_OR_DEPARTMENT_OTHER): Payer: Medicare HMO | Admitting: Internal Medicine

## 2023-02-07 NOTE — Progress Notes (Unsigned)
Cardiology Office Note:  .   Date:  02/08/2023  ID:  [name], DOB June 07, 1935, MRN [id number] PCP: [facility] Providers Cardiologist:  [name], MD    Patient Profile: .      PMH CAD S/p CABG x 4 in 2009 (LIMA-LAD, sequential SVG to 1st diagonal and second and third obtuse marginal branches to left Cx, and SVG to posterior descending branch of right coronary) Hypertension Dyslipidemia Statin myalgia Myalgia with Lipitor and to lesser extent Crestor Hoarseness in her voice on simvastatin Myalgia with Zetia  She was referred to Dr. Rennis Golden for management of dyslipidemia.  She reports she has been generally asymptomatic since CABG.  She has not undergone any routine stress testing or echocardiography.  BP has been well-controlled.  Cholesterol has been at goal on low-dose Zocor.  Dr. Rennis Golden recommended a retrial of rosuvastatin and she developed leg pain on both the 5 and 10 mg doses.  She had an LDL of 148 off of therapy.  She was started on Repatha 10/2017.  LDL improved to 72.  At office visit 04/30/2020 she reported she had tired of giving herself injections.  She was agreeable to go back on statin medication, namely simvastatin even though this gave her hoarseness in the past.  She resumed simvastatin and LDL was 90. She continued to feel that simvastatin caused worsening vocal hoarseness. Dr. Rennis Golden encouraged her to see ENT.  At last office visit 09/27/2022 she reported scratchiness in throat she thought related to allergies.  After stopping her simvastatin she had some improvement in vocal hoarseness however it is still an issue.  Her LDL was 130 off simvastatin.  She agreed to resume Repatha.        History of Present Illness: .   [name] is a very pleasant 88 y.o. female who is here today for follow-up of dyslipidemia. She reports she is having chest discomfort on the left side, feels like a sudden shock and only lasts  a minute and goes away on its own. No pain with walking or gardening. She does not do any regular exercise. She denies shortness of breath, orthopnea, PND, edema. Has presyncope on occasion if her BP is low. Continues to have voice hoarseness but cannot find an allergy doctor in network for her. Recently has noticed some higher BP readings at home, mostly < 140  mmHg. She has been back on Repatha since about April, no problems with the medication. She does not cook, eats out most meals. Eats at [facility] frequently - likes salads, hamburgers, grilled chicken, vegetables; admits she probably eats too much cheese.   ROS: See HPI       Studies Reviewed: Marland Kitchen       EKG Interpretation Date/Time:  Wednesday February 08 2023 14:57:59 EDT Ventricular Rate:  88 PR Interval:    QRS Duration:  80 QT Interval:  370 QTC Calculation: 447 R Axis:   0  Text Interpretation: Atrial fibrillation Cannot rule out Anterior infarct , age undetermined When compared with ECG of 16-Sep-2021 14:31, Atrial fibrillation has replaced Sinus rhythm Nonspecific T wave abnormality now evident in Lateral leads Confirmed by Eligha Bridegroom [phone]) on 02/08/2023 3:36:04 PM    Risk Assessment/Calculations:    CHA2DS2-VASc Score = 4   This indicates a 4.8% annual risk of stroke. The patient's score is based upon: CHF History: 0 HTN History: 1 Diabetes History: 0 Stroke History: 0 Vascular Disease History:  0 Age Score: 2 Gender Score: 1            Physical Exam:   VS:  BP 118/70   Pulse 87   Ht 5\' 1"  (1.549 m)   Wt 121 lb 8 oz (55.1 kg)   SpO2 97%   BMI 22.96 kg/m    Wt Readings from Last 3 Encounters:  02/08/23 121 lb 8 oz (55.1 kg)  09/27/22 124 lb (56.2 kg)  09/23/21 130 lb (59 kg)    GEN: Well nourished, well developed in no acute distress NECK: No JVD; No carotid bruits CARDIAC: Irregular RR, no murmurs, rubs, gallops RESPIRATORY:  Clear to auscultation without rales, wheezing or rhonchi  ABDOMEN: Soft,  non-tender, non-distended EXTREMITIES:  No edema; No deformity     ASSESSMENT AND PLAN: .    New onset atrial fibrillation: Irregular heart rate noted on exam.  EKG confirmed atrial fibrillation at 88 bpm.  She reported recent chest discomfort lasting for a few seconds at the time. Also having fluctuations in her BP at home.  She is unsure how long the symptoms have been present.  I suspect these changes were due to new onset A-fib.  We will have her stop aspirin and start Eliquis 2.5 mg twice daily which is appropriate dose for her age and weight for stroke prevention for CHA2DS2-VASc score of 4. Encouraged her to check with pharmacy regarding cost once she has completed the samples provided today. HR is well controlled.  We will continue metoprolol 25 mg twice daily.  We will have her follow-up in 3 to 4 weeks for consideration of DCCV. I briefly explained this procedure.   Dyslipidemia LDL goal < 70: Normal Lp(a) at 69.8.  LDL 79, triglycerides 82, LDL particle number 768, HDL 57 on 02/06/2023.  We discussed these findings in relation to past history and her intolerance of statins. She is close to goal with LDL 79 on Repatha.  She admits to some dietary inhibition. We discussed possibly reducing intake of cheese. We will continue Repatha for now and plan to recheck lipids in 6-12 months.   Hypertension: She was concerned that BP was initially elevated but it improved on my recheck.  She reports labile BP at home. I suspect her monitor was picking up irregular HR . No changes to anti-hypertensive therapy today.   CAD: History of CABG 2009. Mild chest discomfort as noted above. Symptoms do not worsen with exertion and are not associated with additional symptoms of shortness of breath, palpitations, orthopnea, presyncope, syncope.  No indication for further ischemia evaluation at this time.  We are stopping aspirin in the setting of starting OAC. We will continue Repatha, losartan, metoprolol.        Dispo: 3-4 weeks with Dr. Rennis Golden or APP  Signed, Eligha Bridegroom, NP-C

## 2023-02-08 ENCOUNTER — Encounter (HOSPITAL_BASED_OUTPATIENT_CLINIC_OR_DEPARTMENT_OTHER): Payer: Self-pay | Admitting: Nurse Practitioner

## 2023-02-08 ENCOUNTER — Ambulatory Visit (INDEPENDENT_AMBULATORY_CARE_PROVIDER_SITE_OTHER): Payer: Medicare Other | Admitting: Nurse Practitioner

## 2023-02-08 VITALS — BP 118/70 | HR 87 | Ht 61.0 in | Wt 121.5 lb

## 2023-02-08 DIAGNOSIS — Z951 Presence of aortocoronary bypass graft: Secondary | ICD-10-CM | POA: Diagnosis not present

## 2023-02-08 DIAGNOSIS — I2510 Atherosclerotic heart disease of native coronary artery without angina pectoris: Secondary | ICD-10-CM | POA: Diagnosis not present

## 2023-02-08 DIAGNOSIS — I4891 Unspecified atrial fibrillation: Secondary | ICD-10-CM | POA: Diagnosis not present

## 2023-02-08 DIAGNOSIS — M791 Myalgia, unspecified site: Secondary | ICD-10-CM

## 2023-02-08 DIAGNOSIS — I10 Essential (primary) hypertension: Secondary | ICD-10-CM | POA: Diagnosis not present

## 2023-02-08 DIAGNOSIS — T466X5D Adverse effect of antihyperlipidemic and antiarteriosclerotic drugs, subsequent encounter: Secondary | ICD-10-CM | POA: Diagnosis not present

## 2023-02-08 DIAGNOSIS — E785 Hyperlipidemia, unspecified: Secondary | ICD-10-CM | POA: Diagnosis not present

## 2023-02-08 DIAGNOSIS — I499 Cardiac arrhythmia, unspecified: Secondary | ICD-10-CM

## 2023-02-08 LAB — LIPOPROTEIN A (LPA): Lipoprotein (a): 69.8 nmol/L (ref <75.0)

## 2023-02-08 MED ORDER — APIXABAN 2.5 MG PO TABS: 2.5000 mg | ORAL_TABLET | Freq: Two times a day (BID) | ORAL | Status: DC

## 2023-02-08 MED ORDER — APIXABAN 2.5 MG PO TABS: 2.5000 mg | ORAL_TABLET | Freq: Two times a day (BID) | ORAL | 1 refills | Status: DC

## 2023-02-08 NOTE — Patient Instructions (Signed)
Medication Instructions:  STOP ASPIRIN   START ELIQUIS 2.5 MG TWICE A DAY   *If you need a refill on your cardiac medications before your next appointment, please call your pharmacy*  Lab Work: NONE  Testing/Procedures: NONE  Follow-Up: At [facility], you and your health needs are our priority.  As part of our continuing mission to provide you with exceptional heart care, we have created designated Provider Care Teams.  These Care Teams include your primary Cardiologist (physician) and Advanced Practice Providers (APPs -  Physician Assistants and Nurse Practitioners) who all work together to provide you with the care you need, when you need it.  We recommend signing up for the patient portal called "MyChart".  Sign up information is provided on this After Visit Summary.  MyChart is used to connect with patients for Virtual Visits (Telemedicine).  Patients are able to view lab/test results, encounter notes, upcoming appointments, etc.  Non-urgent messages can be sent to your provider as well.   To learn more about what you can do with MyChart, go to [email or web address].    Your next appointment:   3-4 week(s)  Provider:   DR Leida Lauth NP, OR MICHELLE S  EITHER [facility] OR [facility]

## 2023-03-07 ENCOUNTER — Ambulatory Visit (HOSPITAL_BASED_OUTPATIENT_CLINIC_OR_DEPARTMENT_OTHER): Payer: Medicare Other | Admitting: Family

## 2023-03-07 ENCOUNTER — Encounter (HOSPITAL_BASED_OUTPATIENT_CLINIC_OR_DEPARTMENT_OTHER): Payer: Self-pay | Admitting: Family

## 2023-03-07 VITALS — BP 124/76 | HR 109 | Ht 61.0 in

## 2023-03-07 DIAGNOSIS — E785 Hyperlipidemia, unspecified: Secondary | ICD-10-CM

## 2023-03-07 DIAGNOSIS — M791 Myalgia, unspecified site: Secondary | ICD-10-CM

## 2023-03-07 DIAGNOSIS — I480 Paroxysmal atrial fibrillation: Secondary | ICD-10-CM

## 2023-03-07 DIAGNOSIS — I2510 Atherosclerotic heart disease of native coronary artery without angina pectoris: Secondary | ICD-10-CM

## 2023-03-07 DIAGNOSIS — T466X5D Adverse effect of antihyperlipidemic and antiarteriosclerotic drugs, subsequent encounter: Secondary | ICD-10-CM

## 2023-03-07 DIAGNOSIS — Z01812 Encounter for preprocedural laboratory examination: Secondary | ICD-10-CM

## 2023-03-07 DIAGNOSIS — Z951 Presence of aortocoronary bypass graft: Secondary | ICD-10-CM

## 2023-03-07 DIAGNOSIS — I499 Cardiac arrhythmia, unspecified: Secondary | ICD-10-CM

## 2023-03-07 NOTE — Progress Notes (Unsigned)
Cardiology Office Note:  .   Date:  03/07/2023  ID:  [name], DOB [birth year], MRN [id number] PCP: [facility] Providers Cardiologist:  [name], MD { Click to update primary MD,subspecialty MD or APP then REFRESH:1}   History of Present Illness: .   [name] is a 88 y.o. female with hx of CAD s/p CABGx4 2009 (LIMA-LAD, sequential SVG-1st diagonal and 2nd/3rd obtuse marginal branches to LCx, SVG-PDA), HTN, HLD, statin intolerance, atrial fibrillation.   Last seen 02/08/23 new on set atrial fibrillation symptomatic with labile BP, chest discomfort lasting seconds. Aspirin stopped, Eliquis 2.5mg  BID started. Metoprolol 25mg  BID continued.   Presents today for follow up. ***  Previous lipid lowering agents Atorvastatin - myalgia Crestor - myalgia Simvastatin- hoarseness Zetia - myalgia  ROS: Please see the history of present illness.    All other systems reviewed and are negative.   Studies Reviewed: Marland Kitchen   EKG Interpretation Date/Time:  Tuesday March 07 2023 15:05:28 EDT Ventricular Rate:  109 PR Interval:    QRS Duration:  78 QT Interval:  348 QTC Calculation: 468 R Axis:   -13  Text Interpretation: Atrial flutter with variable A-V block  No acute ST/T wave changes. Confirmed by Gillian Shields ([id number]) on 03/07/2023 3:14:01 PM    Cardiac Studies & Procedures     STRESS TESTS  MYOCARDIAL PERFUSION IMAGING 11/15/2017  Narrative  Nuclear stress EF: 64%. The left ventricular ejection fraction is normal (55-65%).  There was no ST segment deviation noted during stress.  The study is normal. There is no evidence of ischemia. no evidence of infarction  This is a low risk study.              Risk Assessment/Calculations:    CHA2DS2-VASc Score = 4  {Confirm score is correct.  If not, click here to update score.  REFRESH note.  :1} This indicates a 4.8% annual risk of stroke. The patient's score is based  upon: CHF History: 0 HTN History: 1 Diabetes History: 0 Stroke History: 0 Vascular Disease History: 0 Age Score: 2 Gender Score: 1   {This patient has a significant risk of stroke if diagnosed with atrial fibrillation.  Please consider VKA or DOAC agent for anticoagulation if the bleeding risk is acceptable.   You can also use the SmartPhrase .[email or web address] for documentation.   :[id number]} The patient's 1st BP is elevated (>139/89)*** Repeat BP and {Click to enter a 2nd BP Refresh Note  :1}       Physical Exam:   VS:  BP (!) 140/76 (BP Location: Left Arm, Patient Position: Sitting, Cuff Size: Normal)   Pulse (!) 109   Ht 5\' 1"  (1.549 m)   BMI 22.96 kg/m    Wt Readings from Last 3 Encounters:  02/08/23 121 lb 8 oz (55.1 kg)  09/27/22 124 lb (56.2 kg)  09/23/21 130 lb (59 kg)    GEN: Well nourished, well developed in no acute distress NECK: No JVD; No carotid bruits CARDIAC: IRIR, no murmurs, rubs, gallops RESPIRATORY:  Clear to auscultation without rales, wheezing or rhonchi  ABDOMEN: Soft, non-tender, non-distended EXTREMITIES:  No edema; No deformity   ASSESSMENT AND PLAN: .    Atrial fibrillation / Hypercoagulable state - EKG today atrial flutter 109 bpm. ***  CAD s/p CABG / HLD, LDL goal <70 / Myalgia due to statin - *** Stable with no anginal symptoms. No indication for ischemic evaluation.  GDMT  Metoprolol, Repatha. Intolerance to statin with myalgia, detailed above. 01/2023 LDL 79. No ASA due to [facility].   HTN -     Informed Consent   Shared Decision Making/Informed Consent{ All outpatient stress tests require an informed consent ([id number]) ATTESTATION ORDER       :[id number]} The risks (stroke, cardiac arrhythmias rarely resulting in the need for a temporary or permanent pacemaker, skin irritation or burns and complications associated with conscious sedation including aspiration, arrhythmia, respiratory failure and death), benefits (restoration of normal sinus rhythm) and  alternatives of a direct current cardioversion were explained in detail to Ms. Braman and she agrees to proceed.       Dispo: follow up 2 weeks after cardioversion  Signed, Alver Sorrow, NP

## 2023-03-07 NOTE — Patient Instructions (Signed)
Medication Instructions:  INCREASE YOUR METOPROLOL TO 1 AND 1/2 TABLETS TWICE A DAY UNTIL AFTER YOUR CARDIOVERSION, THEN RESUME 1 TABLET TWICE A DAY   *If you need a refill on your cardiac medications before your next appointment, please call your pharmacy*  Lab Work: CBC/BMET TODAY   If you have labs (blood work) drawn today and your tests are completely normal, you will receive your results only by: MyChart Message (if you have MyChart) OR A paper copy in the mail If you have any lab test that is abnormal or we need to change your treatment, we will call you to review the results.  Testing/Procedures: CARDIOVERSION   Follow-Up: At [facility], you and your health needs are our priority.  As part of our continuing mission to provide you with exceptional heart care, we have created designated Provider Care Teams.  These Care Teams include your primary Cardiologist (physician) and Advanced Practice Providers (APPs -  Physician Assistants and Nurse Practitioners) who all work together to provide you with the care you need, when you need it.  We recommend signing up for the patient portal called "MyChart".  Sign up information is provided on this After Visit Summary.  MyChart is used to connect with patients for Virtual Visits (Telemedicine).  Patients are able to view lab/test results, encounter notes, upcoming appointments, etc.  Non-urgent messages can be sent to your provider as well.   To learn more about what you can do with MyChart, go to [email or web address].    Your next appointment:   2 TO 3  week(s)  Provider:   K. Italy Hilty, MD or Gillian Shields, NP     Other Instructions  You are scheduled for a Cardioversion on Wednesday, September 25 with Dr. Cyndie Chime.  Please arrive at the [facility] (Main Entrance A) at [facility]: [address] [place], [place] [address] at 7:30 AM (This time is 1 hour(s) before your procedure to ensure your preparation). Free valet  parking service is available. You will check in at ADMITTING. The support person will be asked to wait in the waiting room.  It is OK to have someone drop you off and come back when you are ready to be discharged.     DIET:  Nothing to eat or drink after midnight except a sip of water with medications (see medication instructions below)  MEDICATION INSTRUCTIONS: !!IF ANY NEW MEDICATIONS ARE STARTED AFTER TODAY, PLEASE NOTIFY YOUR PROVIDER AS SOON AS POSSIBLE!!  FYI: Medications such as Semaglutide (Ozempic, [place]), Tirzepatide (Mounjaro, Zepbound), Dulaglutide (Trulicity), etc ("GLP1 agonists") AND Canagliflozin (Invokana), Dapagliflozin (Farxiga), Empagliflozin (Jardiance), Ertugliflozin (Steglatro), Bexagliflozin [employer]) or any combination with one of these drugs such as Invokamet (Canagliflozin/Metformin), Synjardy (Empagliflozin/Metformin), etc ("SGLT2 inhibitors") must be held around the time of a procedure. This is not a comprehensive list of all of these drugs. Please review all of your medications and talk to your provider if you take any one of these. If you are not sure, ask your provider.  Continue taking your anticoagulant (blood thinner): Apixaban (Eliquis).  You will need to continue this after your procedure until you are told by your provider that it is safe to stop.    LABS:  TODAY   FYI:  For your safety, and to allow [place] to monitor your vital signs accurately during the surgery/procedure we request: If you have artificial nails, gel coating, SNS etc, please have those removed prior to your surgery/procedure. Not having the nail coverings Barnie Del  removed may result in cancellation or delay of your surgery/procedure.  You must have a responsible person to drive you home and stay in the waiting area during your procedure. Failure to do so could result in cancellation.  Bring your insurance cards.  *Special Note: Every effort is made to have your procedure done on time.  Occasionally there are emergencies that occur at the hospital that may cause delays. Please be patient if a delay does occur.   Electrical Cardioversion Electrical cardioversion is the delivery of a jolt of electricity to restore a normal rhythm to the heart. A rhythm that is too fast or is not regular (arrhythmia) keeps the heart from pumping blood well. There is also another type of cardioversion called a chemical (pharmacologic) cardioversion. This is when your health care provider gives you one or more medicines to bring back your regular heart rhythm. Electrical cardioversion is done as a scheduled procedure for arrhythmiasthat are not life-threatening. Electrical cardioversion may also be done in an emergency for sudden life-threatening arrhythmias. Tell a health care provider about: Any allergies you have. All medicines you are taking, including vitamins, herbs, eye drops, creams, and over-the-counter medicines. Any problems you or family members have had with sedatives or anesthesia. Any bleeding problems you have. Any surgeries you have had, including a pacemaker, defibrillator, or other implanted device. Any medical conditions you have. Whether you are pregnant or may be pregnant. What are the risks? Your provider will talk with you about risks. These include: Allergic reactions to medicines. Irritation to the skin on your chest or back where the sticky pads (electrodes) or paddles were put during electrical cardioversion. A blood clot that breaks free and travels to other parts of your body, such as your brain. Return of a worse abnormal heart rhythm that will need to be treated with medicines, a pacemaker, or an implantable cardioverter defibrillator (ICD). What happens before the procedure? Medicines Your provider may give you: Blood-thinning medicines (anticoagulants) so your blood does not clot as easily. If your provider gives you this medicine, you may need to take it for 4  weeks before the procedure. Medicines to help stabilize your heart rate and rhythm. Ask your provider about: Changing or stopping your regular medicines. These include any diabetes medicines or blood thinners you take. Taking medicines such as aspirin and ibuprofen. These medicines can thin your blood. Do not take them unless your provider tells you to. Taking over-the-counter medicines, vitamins, herbs, and supplements. General instructions Follow instructions from your provider about what you may eat and drink. Do not put any lotions, powders, or ointments on your chest and back for 24 hours before the procedure. They can cause problems with the electrodes or paddles used to deliver electricity to your heart. Do not wear jewelry as this can interfere with delivering electricity to your heart. If you will be going home right after the procedure, plan to have a responsible adult: Take you home from the hospital or clinic. You will not be allowed to drive. Care for you for the time you are told. Tests You may have an exam or testing. This may include: Blood labs. A transesophageal echocardiogram (TEE). What happens during the procedure?     An IV will be inserted into one of your veins. You will be given a sedative. This helps you relax. Electrodes or metal paddles will be placed on your chest. They may be placed in one of these ways: One placed on your right chest, the  other on the left ribs. One placed on your chest and the other on your back. An electrical shock will be delivered. The shock briefly stops (resets) your heart rhythm. Your provider will check to see if your heart rhythm is now normal. Some people need only one shock. Some need more to restore a normal heart rhythm. The procedure may vary among providers and hospitals. What happens after the procedure? Your blood pressure, heart rate, breathing rate, and blood oxygen level will be monitored until you leave the hospital or  clinic. Your heart rhythm will be watched to make sure it does not change. This information is not intended to replace advice given to you by your health care provider. Make sure you discuss any questions you have with your health care provider. Document Revised: 01/27/2022 Document Reviewed: 01/27/2022 Elsevier Patient Education  2024 [employer].

## 2023-03-08 ENCOUNTER — Telehealth: Payer: Self-pay | Admitting: Internal Medicine

## 2023-03-08 NOTE — Telephone Encounter (Signed)
Patient was returning call. Please advise ?

## 2023-03-08 NOTE — Telephone Encounter (Signed)
Alver Sorrow, NP 03/08/2023  8:31 AM EDT     CBC shows anemia much improved previous.  Mildly elevated white blood cell count which is stable compared to previous.  Normal electrolytes.  Kidney function shows slight decrease from prior likely mild dehydration-recommend increasing oral fluid intake.  Proceed with cardioversion as scheduled.    The patient has been notified of the result and verbalized understanding.  All questions (if any) were answered. Cindi Carbon [place], RN 03/08/2023 3:34 PM

## 2023-03-09 ENCOUNTER — Telehealth (HOSPITAL_BASED_OUTPATIENT_CLINIC_OR_DEPARTMENT_OTHER): Payer: Self-pay | Admitting: Internal Medicine

## 2023-03-09 ENCOUNTER — Encounter (HOSPITAL_BASED_OUTPATIENT_CLINIC_OR_DEPARTMENT_OTHER): Payer: Self-pay | Admitting: Family

## 2023-03-09 DIAGNOSIS — I480 Paroxysmal atrial fibrillation: Secondary | ICD-10-CM

## 2023-03-09 NOTE — Telephone Encounter (Signed)
Patient stated she wants a call back from NP [facility] as she wants to cancel her procedure on 9/25 as her insurance will not cover the procedure.

## 2023-03-09 NOTE — Telephone Encounter (Signed)
Discussed with pre-cert team.  Unfortunately her insurance is not in network with [facility] for cardioversion.    I have placed an urgent referral to [facility] so she can hopefully have visit and cardioversion promptly as she is symptomatic when in atrial fibrillation.    Would also recommend she call [facility] [phone] to follow-up on the status of this referral.  She should continue Metoprolol Tartrate 37.5mg  BID - please provide refill.  Christine Sorrow, NP

## 2023-03-10 ENCOUNTER — Encounter (HOSPITAL_BASED_OUTPATIENT_CLINIC_OR_DEPARTMENT_OTHER): Payer: Self-pay | Admitting: Internal Medicine

## 2023-03-10 MED ORDER — METOPROLOL TARTRATE 25 MG PO TABS: ORAL_TABLET | ORAL | 3 refills | Status: DC

## 2023-03-10 NOTE — Telephone Encounter (Signed)
Patient is calling stating she was advised she would receive a callback today from [facility] regarding her procedure having to be rescheduled, but has not heard anything.  Please advise.

## 2023-03-10 NOTE — Addendum Note (Signed)
Addended by: Alver Sorrow on: 03/10/2023 05:20 PM   Modules accepted: Orders

## 2023-03-10 NOTE — Telephone Encounter (Signed)
This encounter was created in error - please disregard.

## 2023-03-10 NOTE — Telephone Encounter (Signed)
Olivia Mackie from [facility] called in and stated that the referral for Cardiology is being faxed to the wrong place.  The fax it coming to them.  She stated that the [phone] was the hosp appt line.  She thinks the phone number for Cardiology is [phone] but she is not sure of the fax number.  She just wanted to make someone aware it was going to the wrong place.

## 2023-03-10 NOTE — Telephone Encounter (Signed)
Called and got fax number, [phone] Refaxed referral

## 2023-03-10 NOTE — Telephone Encounter (Signed)
Discussed with patient and advised referral sent to [facility]  New Rx sent to [facility] Patient has spoken with her insurance and is trying to work out so she can have at [facility] Was told by her insurance that if she changes PCP to a [facility] PCP could have at [facility] facility  Advised would have someone in billing department reach out to discuss further

## 2023-03-13 NOTE — Telephone Encounter (Signed)
Spoke with patient and she was able to get new PCP and was told by her insurance company she could proceed with [facility] doing cardioversion  She will call her insurance company tomorrow am to see if new card with new number will be issued I will call tomorrow and reschedule cardioversion

## 2023-03-14 NOTE — Telephone Encounter (Signed)
Patient called wanting to speak with [facility].

## 2023-03-14 NOTE — Telephone Encounter (Signed)
Spoke with patient and cancelled cardioversion for tomorrow  Will await appointment with referral to [facility]

## 2023-03-15 ENCOUNTER — Ambulatory Visit (HOSPITAL_COMMUNITY): Admission: RE | Admit: 2023-03-15 | Payer: Medicare Other | Source: Ambulatory Visit | Admitting: Internal Medicine

## 2023-03-15 ENCOUNTER — Encounter (HOSPITAL_COMMUNITY): Admission: RE | Payer: Self-pay | Source: Ambulatory Visit

## 2023-03-15 SURGERY — CARDIOVERSION
Anesthesia: General

## 2023-03-20 ENCOUNTER — Telehealth (HOSPITAL_BASED_OUTPATIENT_CLINIC_OR_DEPARTMENT_OTHER): Payer: Self-pay | Admitting: Family

## 2023-03-20 NOTE — Telephone Encounter (Signed)
New Message:     Patient called and said she needed to talk to [facility] about her Cardioversion please.

## 2023-03-20 NOTE — Telephone Encounter (Signed)
Spoke with patient who called to follow up on her appointment with [facility] Called and spoke with [facility], referral received and they are reviewing to see if able to just schedule procedure or have to come in for visit Advised patient, verbalized understanding. If she does not hear back soon, please call back to follow up

## 2023-03-23 ENCOUNTER — Other Ambulatory Visit: Payer: Self-pay | Admitting: Obstetrics and Gynecology

## 2023-03-23 DIAGNOSIS — Z1231 Encounter for screening mammogram for malignant neoplasm of breast: Secondary | ICD-10-CM

## 2023-03-23 NOTE — Telephone Encounter (Signed)
Sorry, wasn't sure how to follow up on this one for you!

## 2023-03-23 NOTE — Telephone Encounter (Signed)
Patient called to follow-up stating she has not heard from [facility] as yet.

## 2023-03-24 NOTE — Telephone Encounter (Signed)
Spoke with patient and gave the number [phone]) to [facility] so she can call them directly Patient appreciated number and call back

## 2023-03-28 DIAGNOSIS — I4819 Other persistent atrial fibrillation: Secondary | ICD-10-CM | POA: Diagnosis not present

## 2023-03-28 DIAGNOSIS — I4892 Unspecified atrial flutter: Secondary | ICD-10-CM | POA: Diagnosis not present

## 2023-03-28 DIAGNOSIS — I4891 Unspecified atrial fibrillation: Secondary | ICD-10-CM | POA: Diagnosis not present

## 2023-04-14 ENCOUNTER — Ambulatory Visit: Payer: Medicare Other | Admitting: Family

## 2023-04-21 ENCOUNTER — Telehealth: Payer: Self-pay | Admitting: Internal Medicine

## 2023-04-21 NOTE — Telephone Encounter (Signed)
Pt is returning call to nurse. Requesting return call.

## 2023-04-21 NOTE — Telephone Encounter (Signed)
Returned call to patient,   Patient states she received a call from [facility] asking her to call. Advised patient nothing documented that needed call back, advised her would speak with melinda and would call her back if we needed something.

## 2023-04-27 ENCOUNTER — Ambulatory Visit: Payer: Medicare Other

## 2023-05-12 ENCOUNTER — Other Ambulatory Visit (HOSPITAL_BASED_OUTPATIENT_CLINIC_OR_DEPARTMENT_OTHER): Payer: Self-pay | Admitting: Nurse Practitioner

## 2023-05-12 NOTE — Telephone Encounter (Signed)
Pt last saw Gillian Shields, NP 03/07/23, last labs 03/28/23 Creat 1.24, age 88, weight 55.1kg, based on specified criteria pt is on appropriate dosage of Eliquis 2.5mg  BID for afib.  Will refill rx.  Pt is seeing [facility] for [facility] since [facility] is out of network, but no indication pt is switching physicians at this time.  Will refill rx.

## 2023-05-17 ENCOUNTER — Ambulatory Visit: Payer: Medicare Other

## 2023-05-26 ENCOUNTER — Telehealth: Payer: Self-pay | Admitting: Internal Medicine

## 2023-05-26 NOTE — Telephone Encounter (Signed)
Patient identification verified by 2 forms. Marilynn Rail, RN    Called and spoke to patient  Patient states:   -felt like she was going to pass out yesterday   -also felt like this on Thanksgiving after standing in line for 1.5 hours   -episode yesterday occurred after she was cleaning her home   -during episodes she felt like she was drained of energy   -today feels fine  -Continues to take losartan, metoprolol and eliquis   -she may have taken BP medication a lot closer together    -Had cardioversion 03/28/23  -canceled follow up appointment in [place] for follow up cardioversion  Patient denies:   -lightheadedness/dizziness  Patient scheduled for OV 12/19 at 2:45pm  Advised patient to take BID medications ~12 hours apart  Reviewed ED warning signs/precautions  Patient verbalized understanding, no questions at this time

## 2023-05-26 NOTE — Telephone Encounter (Signed)
.  SYNCOPECHMG   Pt c/o Syncope: STAT if syncope occurred within 24 hours and pt complains of lightheadedness.   High Priority if episode of passing out, completely, today or in last 24 hours   1. Did you pass out today?  no  2. When is the last time you passed out?  Yesterday, almost passed out but not quite, has not completely lost consciousness yet.  3. Has this occurred multiple times? Yes, also on Thanksgiving  4. Did you have any symptoms prior to passing out? Severe fatigue  5. Did you fall? If so, are you on a blood thinner?  No falls, yes she does take blood thinner, Eliquis  Patient had a cardioversion toward end of October at [facility]

## 2023-06-08 ENCOUNTER — Ambulatory Visit: Payer: Medicare Other | Admitting: Physician Assistant

## 2023-07-17 IMAGING — CT CT ABD-PELV W/ CM
2 of 5 series · 12 of 46 positions shown, 14 images · IV contrast (iopamidol)
Comparison: 04/01/2010

CLINICAL DATA: Abdominal pain and constipation for 1.5 months.

EXAM:
CT ABDOMEN AND PELVIS WITH CONTRAST
TECHNIQUE: Multidetector CT imaging of the abdomen and pelvis was performed
using the standard protocol following bolus administration of
intravenous contrast.
CONTRAST:  100mL [id number] IOPAMIDOL ([id number]) INJECTION 61%

[Series 2: abd pelvis 5.00 br40 s3 axial · axial · 0.53mm/px · z∈[+1424,+1779]mm · 9 of 81 slices shown, 11 images]
[im 5/81  soft-tissue]
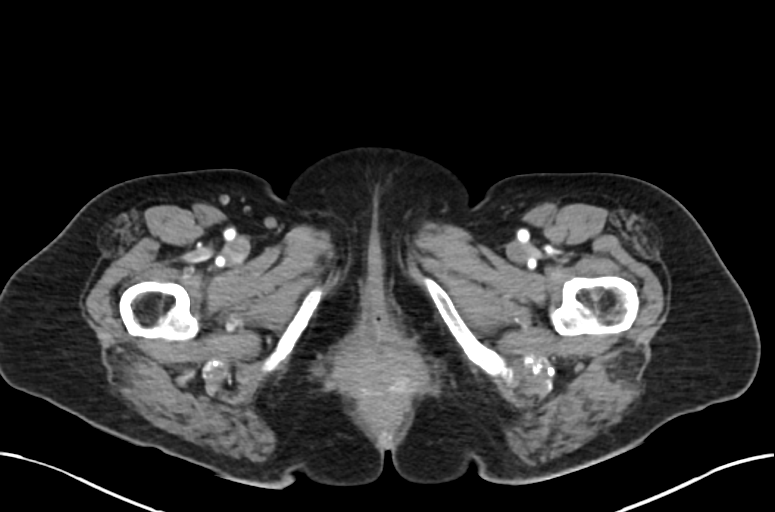
[im 5/81  bone]
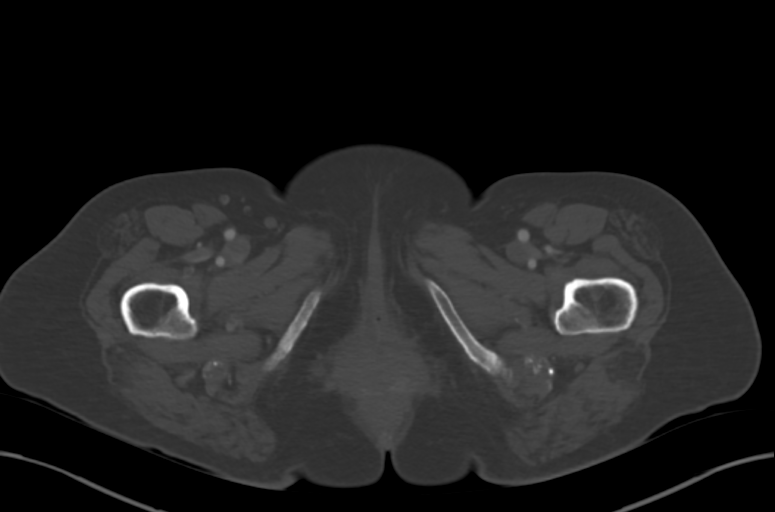
[im 14/81  soft-tissue]
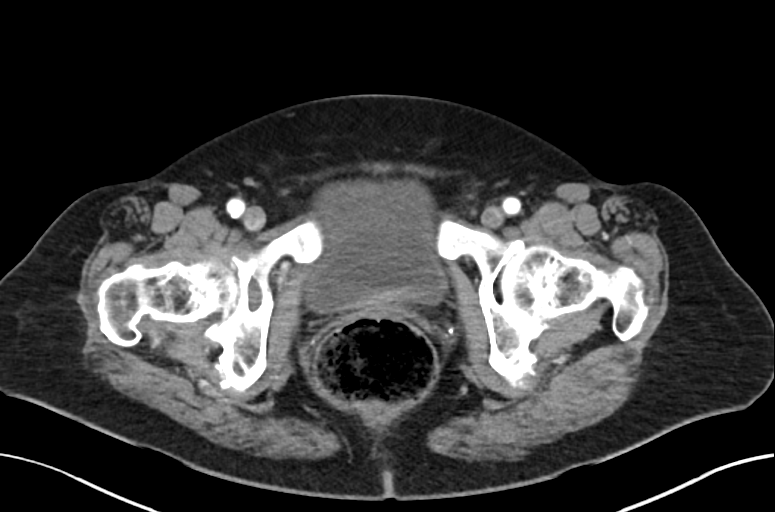
[im 23/81  soft-tissue]
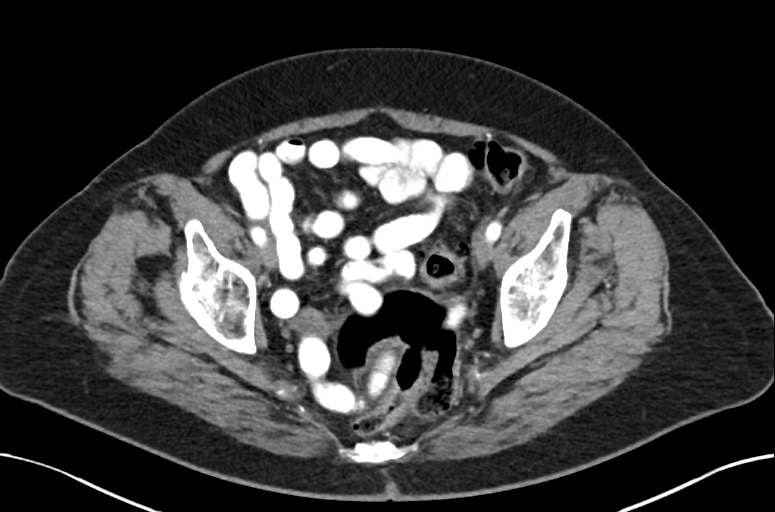
[im 32/81  soft-tissue]
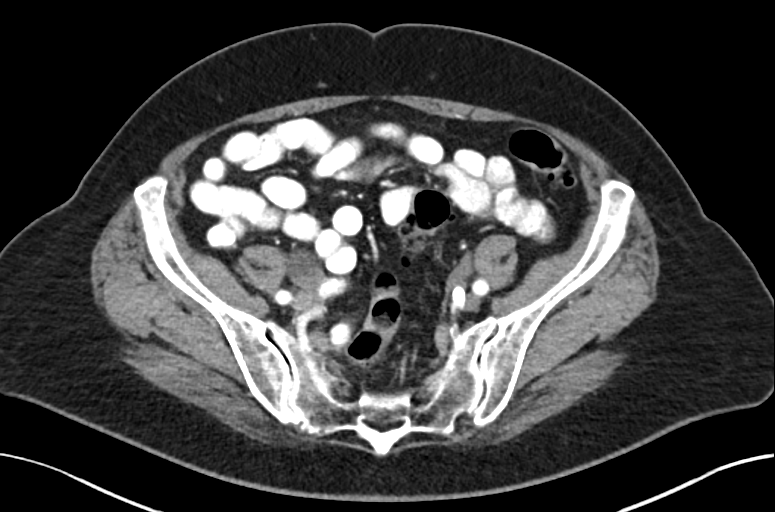
[im 41/81  soft-tissue]
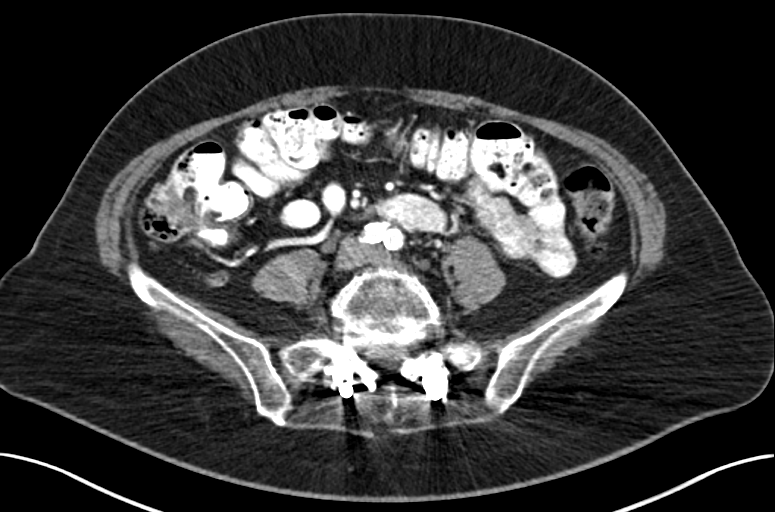
[im 49/81  soft-tissue]
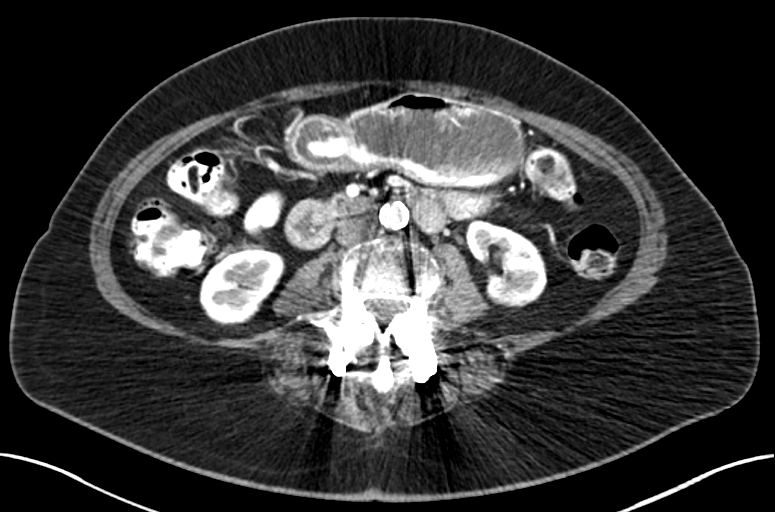
[im 58/81  soft-tissue]
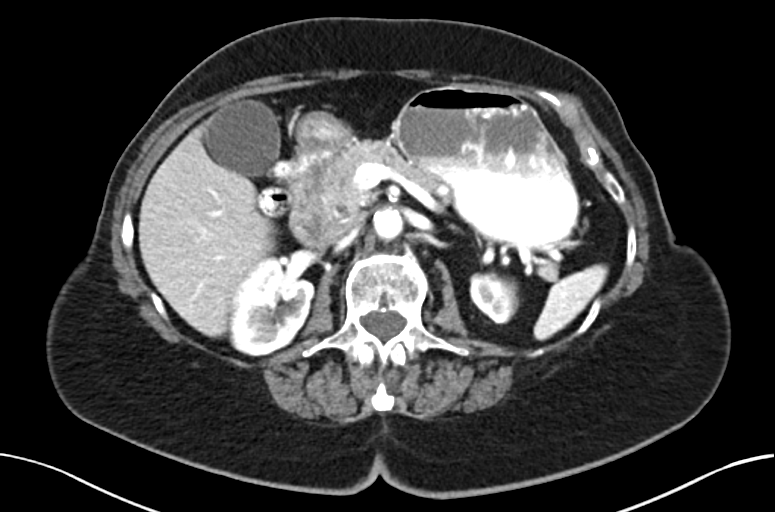
[im 67/81  soft-tissue]
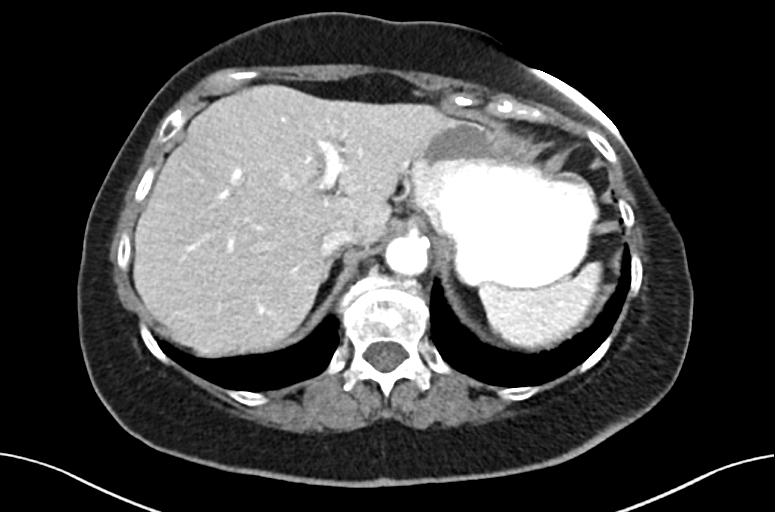
[im 76/81  soft-tissue]
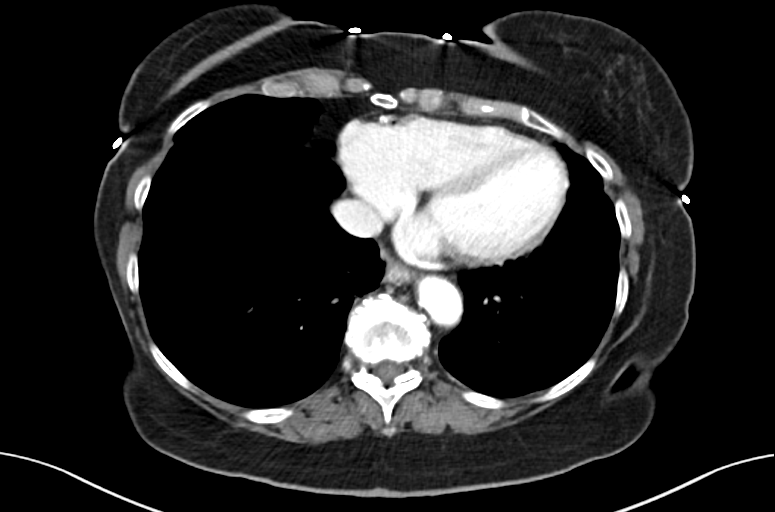
[im 76/81  bone]
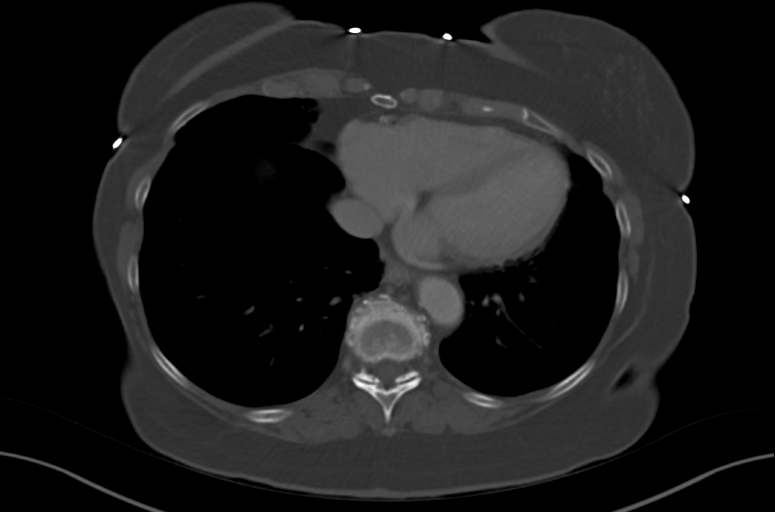

[Series 6: abd pelvis 2.00 br40 s3 cor · coronal · 0.80mm/px · 3 of 137 slices shown]
[im 46/137  soft-tissue]
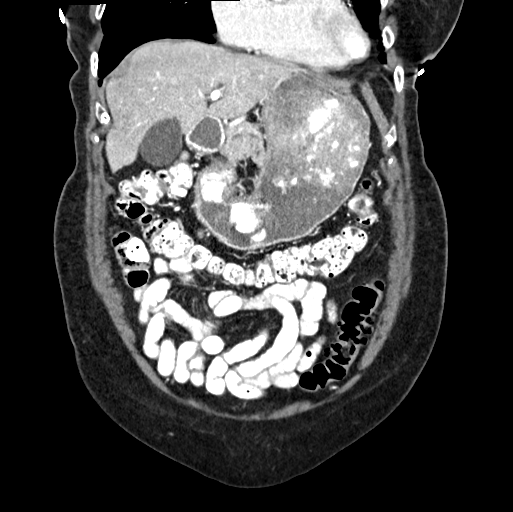
[im 61/137  soft-tissue]
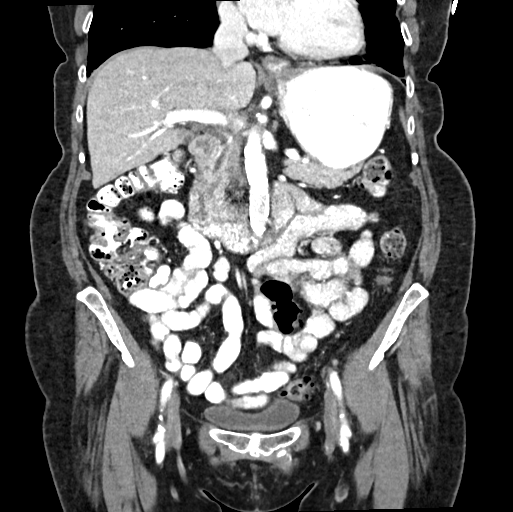
[im 76/137  soft-tissue]
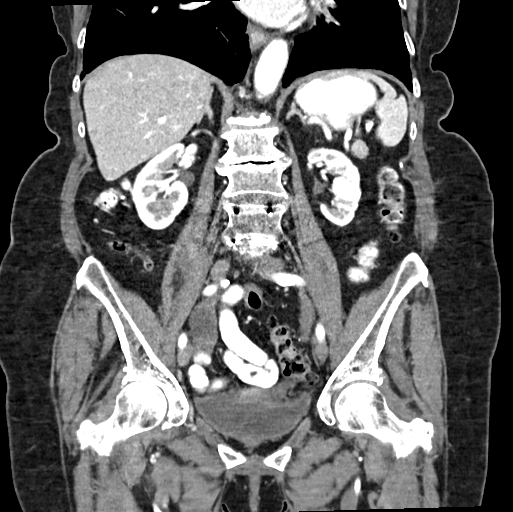

[12 of 46 positions shown; findings below may reference images not displayed]

FINDINGS: Lower Chest: Sub-cm pulmonary nodule in posterior right lower lobe
is stable, consistent with benign etiology. Scarring noted in the
inferior right middle lobe.

Hepatobiliary: No hepatic masses identified. Gallbladder is
unremarkable. No evidence of biliary ductal dilatation.

Pancreas:  No mass or inflammatory changes.

Spleen: Within normal limits in size and appearance.

Adrenals/Urinary Tract: No masses identified. No evidence of
ureteral calculi or hydronephrosis.

Stomach/Bowel: No evidence of obstruction, inflammatory process or
abnormal fluid collections. Normal appendix visualized.
Diverticulosis is seen mainly involving the sigmoid colon, however
there is no evidence of diverticulitis.

Vascular/Lymphatic: No pathologically enlarged lymph nodes. No acute
vascular findings. Aortic atherosclerotic calcification noted.

Reproductive: Prior hysterectomy noted. Adnexal regions are
unremarkable in appearance.

Other:  None.

Musculoskeletal: No suspicious bone lesions identified. Lumbar spine
fusion hardware again noted.
IMPRESSION: Colonic diverticulosis. No radiographic evidence of diverticulitis
or other acute findings.

Aortic Atherosclerosis ([id number].D).

## 2023-07-20 DIAGNOSIS — R21 Rash and other nonspecific skin eruption: Secondary | ICD-10-CM | POA: Diagnosis not present

## 2023-07-25 ENCOUNTER — Other Ambulatory Visit (HOSPITAL_COMMUNITY): Payer: Self-pay

## 2023-07-25 ENCOUNTER — Telehealth: Payer: Self-pay | Admitting: Pharmacy Technician

## 2023-07-25 NOTE — Telephone Encounter (Signed)
 Pharmacy Patient Advocate Encounter   Received notification from  staff messages  that prior authorization for Repatha  SureClick 140MG /ML auto-injectors is required/requested.   Insurance verification completed.   The patient is insured through [employer] .   Per test claim: PA required; PA submitted to above mentioned insurance via CoverMyMeds Key/confirmation #/EOC [id number] Status is pending

## 2023-07-26 NOTE — Telephone Encounter (Signed)
 Pharmacy Patient Advocate Encounter  Received notification from [facility] that Prior Authorization for Repatha  SureClick 140MG /ML auto-injectors has been APPROVED from 07/25/23 to 01/22/24. Spoke to pharmacy to process.Copay is $141.00 for 90 days.    PA #/Case ID/Reference #: [id number]

## 2023-09-05 ENCOUNTER — Telehealth: Payer: Self-pay | Admitting: Internal Medicine

## 2023-09-05 NOTE — Telephone Encounter (Signed)
 Patient identification verified by 2 forms. Marilynn Rail, RN    Called and spoke to patient  Relayed information  Patient verbalized understanding, no questions at this time

## 2023-09-05 NOTE — Telephone Encounter (Signed)
 Pt c/o medication issue:  1. Name of Medication: Evolocumab (REPATHA SURECLICK) 140 MG/ML SOAJ   2. How are you currently taking this medication (dosage and times per day)? as written  3. Are you having a reaction (difficulty breathing--STAT)? no  4. What is your medication issue? [facility]  told her there is no more money for the Repatha patient assistance

## 2023-09-05 NOTE — Telephone Encounter (Signed)
 Patient re-enrolled in [facility] grant:  Card No. [id number]  BIN [id number]  PCN [id number]  PC Group [id number]

## 2023-09-13 DIAGNOSIS — L308 Other specified dermatitis: Secondary | ICD-10-CM | POA: Diagnosis not present

## 2023-09-13 DIAGNOSIS — L821 Other seborrheic keratosis: Secondary | ICD-10-CM | POA: Diagnosis not present

## 2023-09-13 DIAGNOSIS — L814 Other melanin hyperpigmentation: Secondary | ICD-10-CM | POA: Diagnosis not present

## 2023-11-05 ENCOUNTER — Other Ambulatory Visit (HOSPITAL_BASED_OUTPATIENT_CLINIC_OR_DEPARTMENT_OTHER): Payer: Self-pay | Admitting: Internal Medicine

## 2023-11-07 NOTE — Telephone Encounter (Signed)
 Dr. Candida Chalk pt. As she has cancelled her last appt which was a followup to a loss of consciousness, does Dr. Maximo Spar want to refill this?

## 2023-11-14 DIAGNOSIS — H6121 Impacted cerumen, right ear: Secondary | ICD-10-CM | POA: Diagnosis not present

## 2023-11-29 ENCOUNTER — Other Ambulatory Visit: Payer: Self-pay | Admitting: Internal Medicine

## 2023-12-15 ENCOUNTER — Other Ambulatory Visit (HOSPITAL_BASED_OUTPATIENT_CLINIC_OR_DEPARTMENT_OTHER): Payer: Self-pay | Admitting: Internal Medicine

## 2023-12-15 NOTE — Telephone Encounter (Signed)
 Prescription refill request for Eliquis  received. Indication:afib Last office visit:9/24 Scr:1.24  10/24 Age: 89 Weight:55.1  kg  Prescription refilled

## 2024-03-01 DIAGNOSIS — H1031 Unspecified acute conjunctivitis, right eye: Secondary | ICD-10-CM | POA: Diagnosis not present

## 2024-03-01 DIAGNOSIS — H00012 Hordeolum externum right lower eyelid: Secondary | ICD-10-CM | POA: Diagnosis not present

## 2024-03-05 ENCOUNTER — Ambulatory Visit

## 2024-06-19 ENCOUNTER — Other Ambulatory Visit (HOSPITAL_BASED_OUTPATIENT_CLINIC_OR_DEPARTMENT_OTHER): Payer: Self-pay | Admitting: Family

## 2024-06-19 ENCOUNTER — Other Ambulatory Visit (HOSPITAL_BASED_OUTPATIENT_CLINIC_OR_DEPARTMENT_OTHER): Payer: Self-pay | Admitting: Internal Medicine

## 2024-06-26 ENCOUNTER — Telehealth: Payer: Self-pay | Admitting: Internal Medicine

## 2024-06-26 NOTE — Telephone Encounter (Signed)
" °*  STAT* If patient is at the pharmacy, call can be transferred to refill team.   1. Which medications need to be refilled? (please list name of each medication and dose if known)   apixaban  (ELIQUIS ) 2.5 MG TABS tablet   Evolocumab  (REPATHA  SURECLICK) 140 MG/ML SOAJ   2. Which pharmacy/location (including street and city if local pharmacy) is medication to be sent to?  [facility] [address] [place] - [address]    3. Do they need a 30 day or 90 day supply? 90   Patient has appt 3/2 "

## 2024-06-27 MED ORDER — APIXABAN 2.5 MG PO TABS: 2.5000 mg | ORAL_TABLET | Freq: Two times a day (BID) | ORAL | 0 refills | Status: AC

## 2024-06-27 MED ORDER — REPATHA SURECLICK 140 MG/ML ~~LOC~~ SOAJ: 140.0000 mg | SUBCUTANEOUS | 0 refills | Status: AC

## 2024-06-27 NOTE — Telephone Encounter (Signed)
 Prescription refill request for Eliquis  received. Indication: A. FIB Last office visit: 03/07/23 Scr: 1.24 Age: 89 Weight: 121 lbs   Patient on low dose eliquis  already. Patient has appointment in March with Dr. Mona will refill until then.

## 2024-06-28 ENCOUNTER — Telehealth: Payer: Self-pay | Admitting: Pharmacy Technician

## 2024-06-28 NOTE — Telephone Encounter (Signed)
" ° °  Pharmacy Patient Advocate Encounter   Received notification from [facility] KEY that prior authorization for REPATHA  is required/requested.   Insurance verification completed.   The patient is insured through [facility].   Per test claim: PA required; PA submitted to above mentioned insurance via Latent Key/confirmation #/EOC [id number] Status is pending  "

## 2024-06-28 NOTE — Telephone Encounter (Signed)
 Pharmacy Patient Advocate Encounter  Received notification from [facility] that Prior Authorization for repatha  has been APPROVED from 06/28/24 to 06/28/25   PA #/Case ID/Reference #: [id number]

## 2024-08-19 ENCOUNTER — Ambulatory Visit: Admitting: Internal Medicine
